# Patient Record
Sex: Female | Born: 1985 | Race: Black or African American | Hispanic: No | Marital: Single | Smoking: Never smoker
Health system: Southern US, Community
[De-identification: ages and names within clinical notes are randomized; demographics above are authoritative.]

## PROBLEM LIST (undated history)

## (undated) DIAGNOSIS — I10 Essential (primary) hypertension: Secondary | ICD-10-CM

## (undated) DIAGNOSIS — J45909 Unspecified asthma, uncomplicated: Secondary | ICD-10-CM

## (undated) DIAGNOSIS — E282 Polycystic ovarian syndrome: Secondary | ICD-10-CM

## (undated) HISTORY — PX: HIP SURGERY: SHX245

## (undated) HISTORY — DX: Essential (primary) hypertension: I10

---
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
## 2004-04-02 ENCOUNTER — Emergency Department (HOSPITAL_COMMUNITY): Admission: EM | Admit: 2004-04-02 | Discharge: 2004-04-02 | Payer: Self-pay | Admitting: Emergency Medicine

## 2008-04-04 ENCOUNTER — Emergency Department (HOSPITAL_COMMUNITY): Admission: EM | Admit: 2008-04-04 | Discharge: 2008-04-04 | Payer: Self-pay | Admitting: Emergency Medicine

## 2008-10-13 ENCOUNTER — Emergency Department (HOSPITAL_COMMUNITY): Admission: EM | Admit: 2008-10-13 | Discharge: 2008-10-14 | Payer: Self-pay | Admitting: Emergency Medicine

## 2010-11-18 LAB — POCT CARDIAC MARKERS
CKMB, poc: <1 ng/mL — ABNORMAL LOW (ref 1.0–8.0)
Myoglobin, poc: 48.4 ng/mL (ref 12–200)

## 2012-12-17 ENCOUNTER — Other Ambulatory Visit (HOSPITAL_COMMUNITY): Payer: Self-pay | Admitting: Physician Assistant

## 2012-12-17 ENCOUNTER — Ambulatory Visit (HOSPITAL_COMMUNITY)
Admission: RE | Admit: 2012-12-17 | Discharge: 2012-12-17 | Disposition: A | Discharge status: Home / Self Care | Payer: BC Managed Care – PPO | Source: Ambulatory Visit | Attending: Physician Assistant | Admitting: Physician Assistant

## 2012-12-17 DIAGNOSIS — M25571 Pain in right ankle and joints of right foot: Secondary | ICD-10-CM

## 2012-12-17 DIAGNOSIS — M25579 Pain in unspecified ankle and joints of unspecified foot: Secondary | ICD-10-CM | POA: Insufficient documentation

## 2015-03-12 ENCOUNTER — Other Ambulatory Visit (HOSPITAL_COMMUNITY): Payer: Self-pay | Admitting: Family Medicine

## 2015-03-12 DIAGNOSIS — N920 Excessive and frequent menstruation with regular cycle: Secondary | ICD-10-CM

## 2015-03-16 ENCOUNTER — Other Ambulatory Visit (HOSPITAL_COMMUNITY): Payer: Self-pay | Admitting: Family Medicine

## 2015-03-16 DIAGNOSIS — N920 Excessive and frequent menstruation with regular cycle: Secondary | ICD-10-CM

## 2015-03-17 ENCOUNTER — Ambulatory Visit (HOSPITAL_COMMUNITY): Payer: BLUE CROSS/BLUE SHIELD

## 2015-03-24 ENCOUNTER — Ambulatory Visit (HOSPITAL_COMMUNITY)
Admission: RE | Admit: 2015-03-24 | Discharge: 2015-03-24 | Disposition: A | Discharge status: Home / Self Care | Payer: BLUE CROSS/BLUE SHIELD | Source: Ambulatory Visit | Attending: Family Medicine | Admitting: Family Medicine

## 2015-03-24 DIAGNOSIS — N920 Excessive and frequent menstruation with regular cycle: Secondary | ICD-10-CM | POA: Insufficient documentation

## 2015-12-29 DIAGNOSIS — Z111 Encounter for screening for respiratory tuberculosis: Secondary | ICD-10-CM | POA: Diagnosis not present

## 2016-03-08 DIAGNOSIS — Z111 Encounter for screening for respiratory tuberculosis: Secondary | ICD-10-CM | POA: Diagnosis not present

## 2016-04-22 DIAGNOSIS — Z6841 Body Mass Index (BMI) 40.0 and over, adult: Secondary | ICD-10-CM | POA: Diagnosis not present

## 2016-04-22 DIAGNOSIS — H60502 Unspecified acute noninfective otitis externa, left ear: Secondary | ICD-10-CM | POA: Diagnosis not present

## 2016-04-22 DIAGNOSIS — Z1389 Encounter for screening for other disorder: Secondary | ICD-10-CM | POA: Diagnosis not present

## 2016-04-29 DIAGNOSIS — H9202 Otalgia, left ear: Secondary | ICD-10-CM | POA: Diagnosis not present

## 2016-04-29 DIAGNOSIS — Z1389 Encounter for screening for other disorder: Secondary | ICD-10-CM | POA: Diagnosis not present

## 2016-04-29 DIAGNOSIS — Z6841 Body Mass Index (BMI) 40.0 and over, adult: Secondary | ICD-10-CM | POA: Diagnosis not present

## 2016-04-29 DIAGNOSIS — H938X2 Other specified disorders of left ear: Secondary | ICD-10-CM | POA: Diagnosis not present

## 2016-09-16 IMAGING — US US PELVIS COMPLETE
1 series · 13 of 25 positions shown · non-contrast
Comparison: None in PACs

CLINICAL DATA: Menorrhagia with regular cycles, began oral
ovarian syndrome. Last menstrual period March 22, 2015.

EXAM:
TRANSABDOMINAL ULTRASOUND OF PELVIS
TECHNIQUE: Transabdominal ultrasound examination of the pelvis was performed
including evaluation of the uterus, ovaries, adnexal regions, and
pelvic cul-de-sac.

[Series 1: us pelvis complete · 0.22mm/px · 13 of 34 slices shown]
[im 1/34]
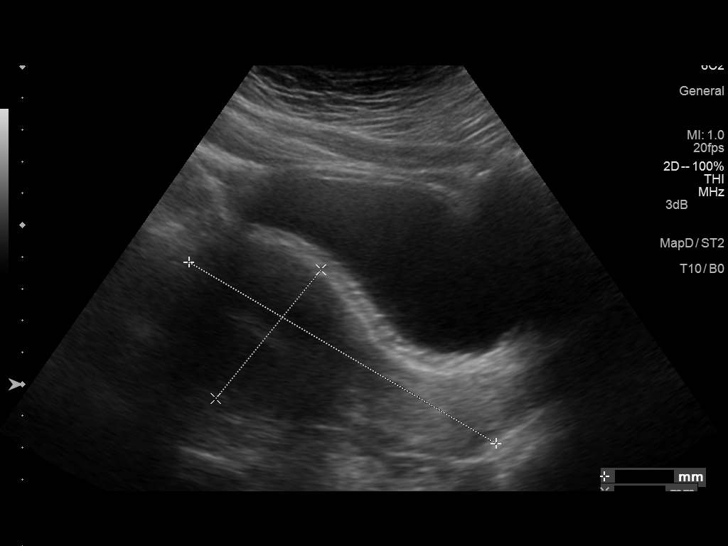
[im 3/34]
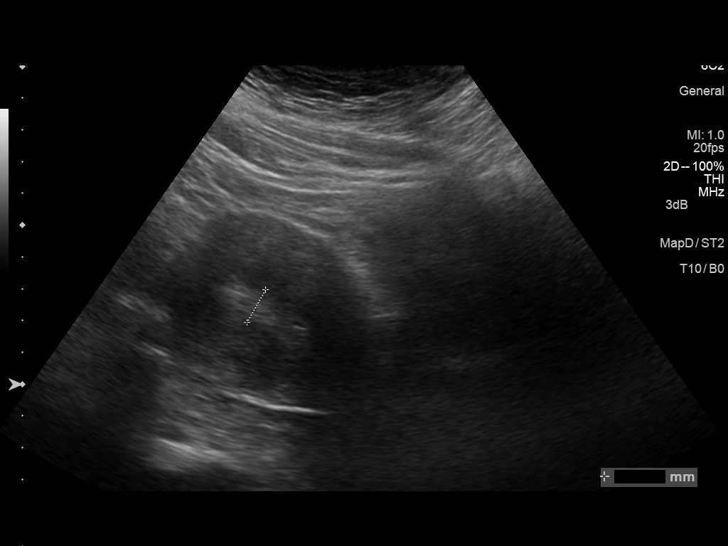
[im 6/34]
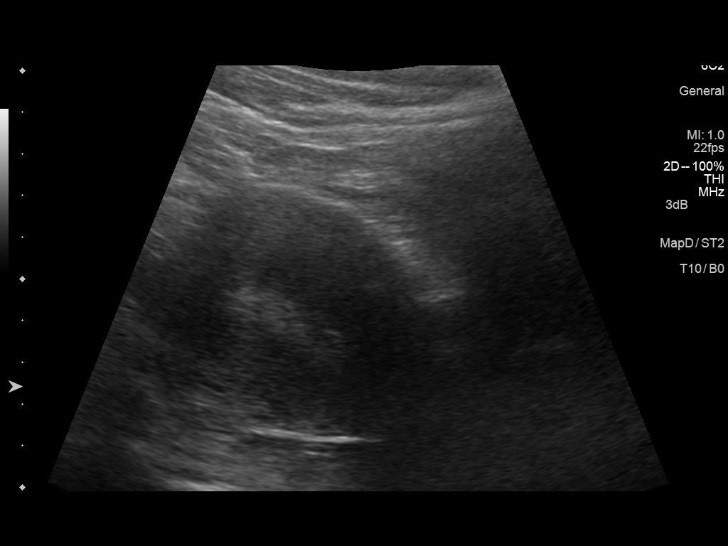
[im 9/34]
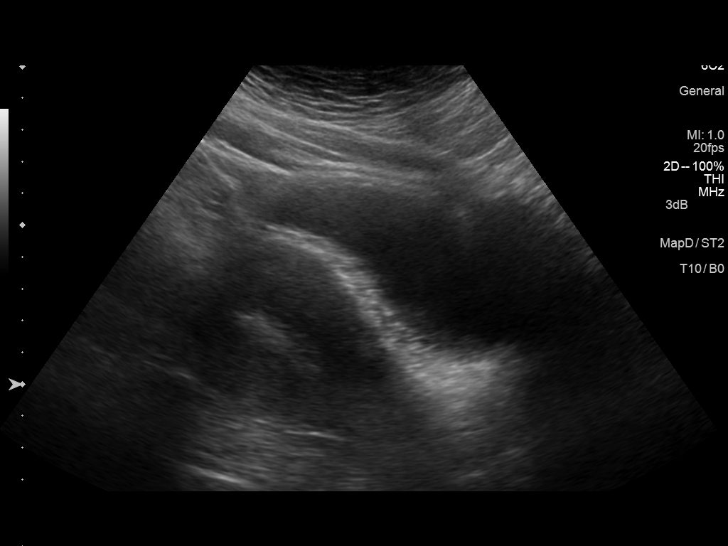
[im 12/34]
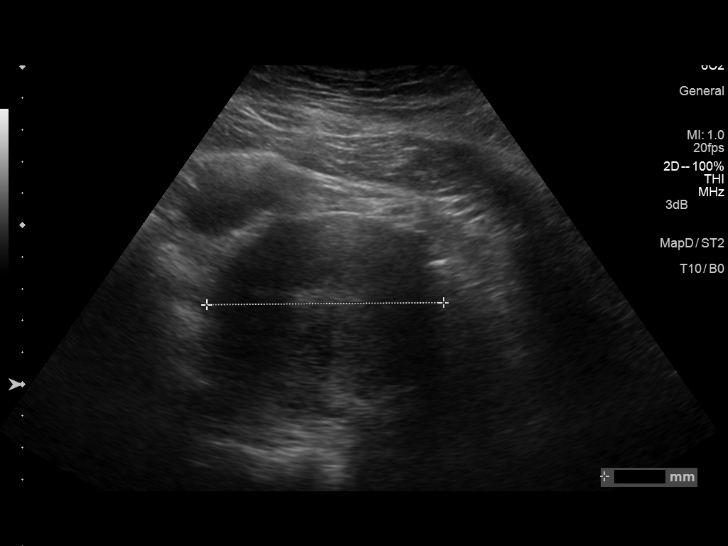
[im 14/34]
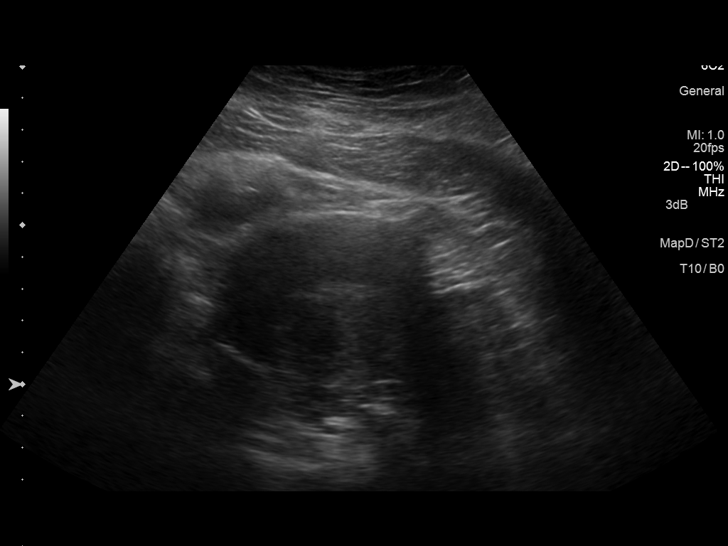
[im 17/34]
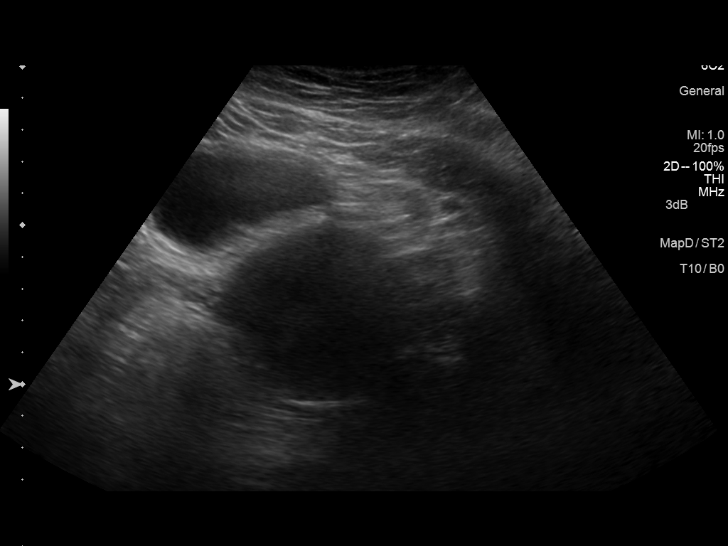
[im 20/34]
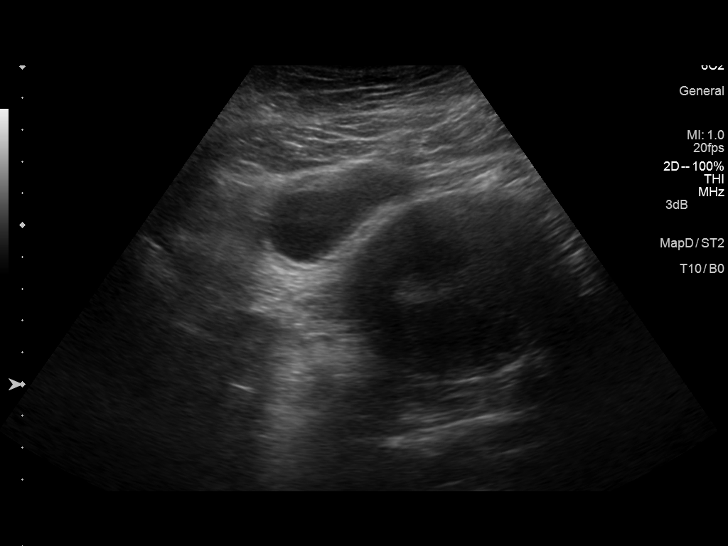
[im 23/34]
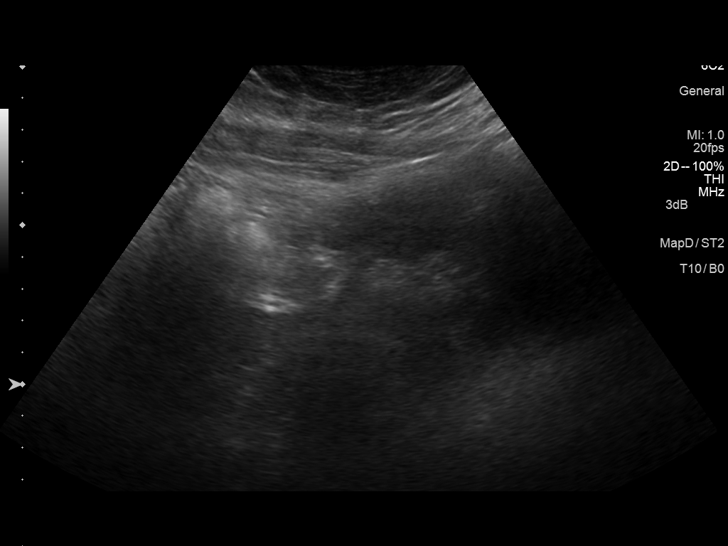
[im 25/34]
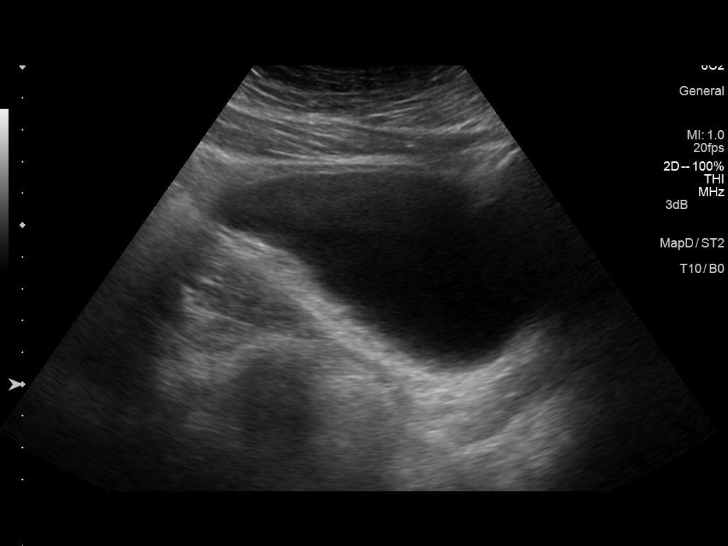
[im 28/34]
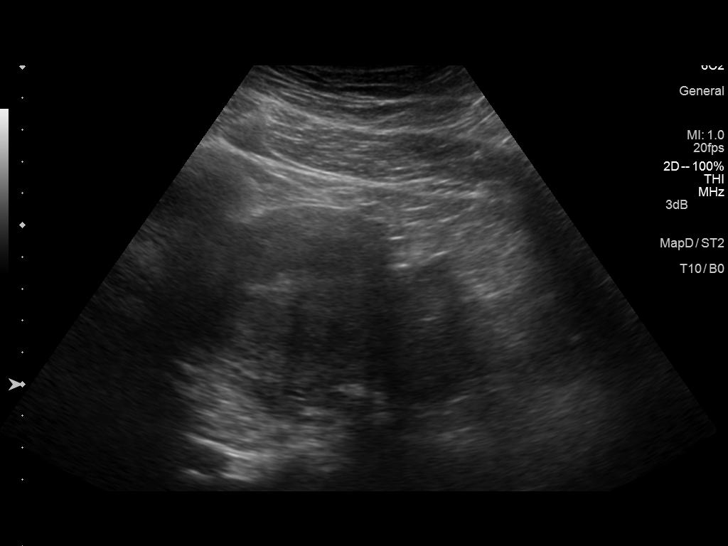
[im 31/34]
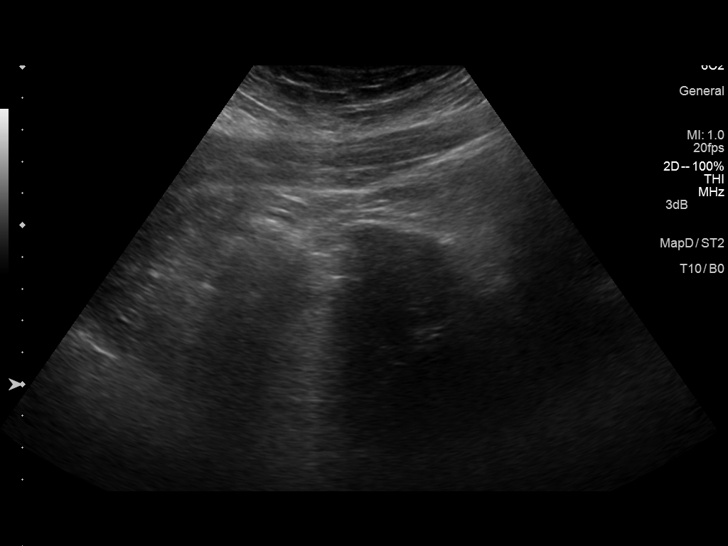
[im 34/34]
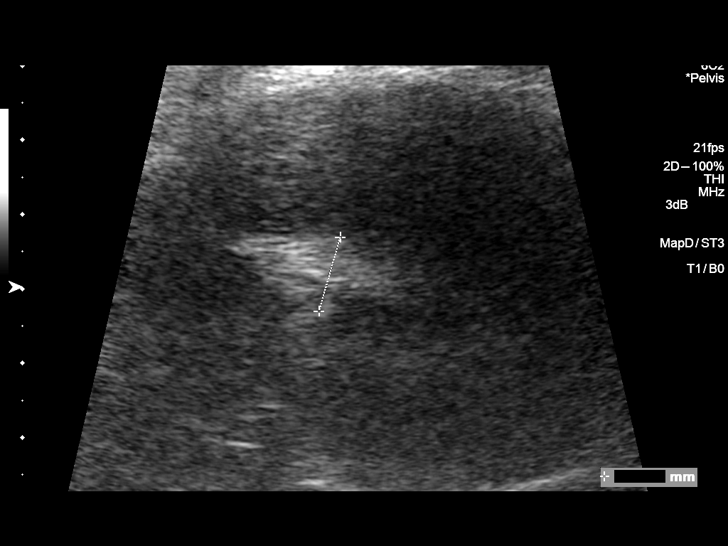

[13 of 25 positions shown; findings below may reference images not displayed]

FINDINGS: Transvaginal imaging was declined by the patient.

Uterus

Measurements: 11.2 x 5.2 x 7.5 cm. No fibroids or other mass
visualized.

Endometrium

Thickness: 12.4 mm.  No focal abnormality visualized.

Right ovary

The right ovary was not visualized.

Left ovary

Measurements: 3.5 x 3.1 x 2.9 cm. Normal appearance/no adnexal mass.

Other findings:  No free fluid
IMPRESSION: 1. The uterus is normal in size and contour. The endometrial stripe
is normal. If bleeding remains unresponsive to hormonal or medical
therapy, sonohysterogram should be considered for focal lesion
work-up. (Ref: Radiological Reasoning: Algorithmic Workup of
Abnormal Vaginal Bleeding with Endovaginal Sonography and
Sonohysterography. AJR 2336; 191:S68-73)
2. Nonvisualization of the right ovary. Normal appearance of the
left ovary. There are no findings to suggest polycystic ovarian
syndrome.
3. There is no free pelvic fluid.

## 2016-10-26 DIAGNOSIS — Z681 Body mass index (BMI) 19 or less, adult: Secondary | ICD-10-CM | POA: Diagnosis not present

## 2016-10-26 DIAGNOSIS — J069 Acute upper respiratory infection, unspecified: Secondary | ICD-10-CM | POA: Diagnosis not present

## 2016-10-26 DIAGNOSIS — J343 Hypertrophy of nasal turbinates: Secondary | ICD-10-CM | POA: Diagnosis not present

## 2016-10-26 DIAGNOSIS — I10 Essential (primary) hypertension: Secondary | ICD-10-CM | POA: Diagnosis not present

## 2016-10-26 DIAGNOSIS — J209 Acute bronchitis, unspecified: Secondary | ICD-10-CM | POA: Diagnosis not present

## 2016-10-26 DIAGNOSIS — J0100 Acute maxillary sinusitis, unspecified: Secondary | ICD-10-CM | POA: Diagnosis not present

## 2016-12-06 DIAGNOSIS — J342 Deviated nasal septum: Secondary | ICD-10-CM | POA: Diagnosis not present

## 2016-12-06 DIAGNOSIS — J310 Chronic rhinitis: Secondary | ICD-10-CM | POA: Diagnosis not present

## 2016-12-06 DIAGNOSIS — J343 Hypertrophy of nasal turbinates: Secondary | ICD-10-CM | POA: Diagnosis not present

## 2017-01-10 DIAGNOSIS — E282 Polycystic ovarian syndrome: Secondary | ICD-10-CM | POA: Diagnosis not present

## 2017-01-10 DIAGNOSIS — N926 Irregular menstruation, unspecified: Secondary | ICD-10-CM | POA: Diagnosis not present

## 2017-01-10 DIAGNOSIS — N760 Acute vaginitis: Secondary | ICD-10-CM | POA: Diagnosis not present

## 2017-01-10 DIAGNOSIS — Z1389 Encounter for screening for other disorder: Secondary | ICD-10-CM | POA: Diagnosis not present

## 2017-01-10 DIAGNOSIS — Z0001 Encounter for general adult medical examination with abnormal findings: Secondary | ICD-10-CM | POA: Diagnosis not present

## 2017-01-10 DIAGNOSIS — L680 Hirsutism: Secondary | ICD-10-CM | POA: Diagnosis not present

## 2017-01-10 DIAGNOSIS — Z6841 Body Mass Index (BMI) 40.0 and over, adult: Secondary | ICD-10-CM | POA: Diagnosis not present

## 2017-03-21 DIAGNOSIS — Z6841 Body Mass Index (BMI) 40.0 and over, adult: Secondary | ICD-10-CM | POA: Diagnosis not present

## 2017-03-21 DIAGNOSIS — Z1151 Encounter for screening for human papillomavirus (HPV): Secondary | ICD-10-CM | POA: Diagnosis not present

## 2017-03-21 DIAGNOSIS — L680 Hirsutism: Secondary | ICD-10-CM | POA: Diagnosis not present

## 2017-03-21 DIAGNOSIS — Z01419 Encounter for gynecological examination (general) (routine) without abnormal findings: Secondary | ICD-10-CM | POA: Diagnosis not present

## 2017-04-04 DIAGNOSIS — R635 Abnormal weight gain: Secondary | ICD-10-CM | POA: Diagnosis not present

## 2017-04-11 DIAGNOSIS — E559 Vitamin D deficiency, unspecified: Secondary | ICD-10-CM | POA: Diagnosis not present

## 2017-04-11 DIAGNOSIS — Z6841 Body Mass Index (BMI) 40.0 and over, adult: Secondary | ICD-10-CM | POA: Diagnosis not present

## 2017-04-11 DIAGNOSIS — R7303 Prediabetes: Secondary | ICD-10-CM | POA: Diagnosis not present

## 2017-04-11 DIAGNOSIS — I10 Essential (primary) hypertension: Secondary | ICD-10-CM | POA: Diagnosis not present

## 2017-04-18 DIAGNOSIS — E282 Polycystic ovarian syndrome: Secondary | ICD-10-CM | POA: Diagnosis not present

## 2017-04-18 DIAGNOSIS — N938 Other specified abnormal uterine and vaginal bleeding: Secondary | ICD-10-CM | POA: Diagnosis not present

## 2017-04-18 DIAGNOSIS — I10 Essential (primary) hypertension: Secondary | ICD-10-CM | POA: Diagnosis not present

## 2017-04-18 DIAGNOSIS — E559 Vitamin D deficiency, unspecified: Secondary | ICD-10-CM | POA: Diagnosis not present

## 2017-04-18 DIAGNOSIS — R7303 Prediabetes: Secondary | ICD-10-CM | POA: Diagnosis not present

## 2017-04-18 DIAGNOSIS — L680 Hirsutism: Secondary | ICD-10-CM | POA: Diagnosis not present

## 2017-05-02 DIAGNOSIS — I10 Essential (primary) hypertension: Secondary | ICD-10-CM | POA: Diagnosis not present

## 2017-05-02 DIAGNOSIS — R7303 Prediabetes: Secondary | ICD-10-CM | POA: Diagnosis not present

## 2017-06-06 DIAGNOSIS — R7303 Prediabetes: Secondary | ICD-10-CM | POA: Diagnosis not present

## 2017-06-06 DIAGNOSIS — I10 Essential (primary) hypertension: Secondary | ICD-10-CM | POA: Diagnosis not present

## 2017-06-06 DIAGNOSIS — E559 Vitamin D deficiency, unspecified: Secondary | ICD-10-CM | POA: Diagnosis not present

## 2017-06-14 DIAGNOSIS — Z1389 Encounter for screening for other disorder: Secondary | ICD-10-CM | POA: Diagnosis not present

## 2017-06-14 DIAGNOSIS — L089 Local infection of the skin and subcutaneous tissue, unspecified: Secondary | ICD-10-CM | POA: Diagnosis not present

## 2017-06-14 DIAGNOSIS — Z6841 Body Mass Index (BMI) 40.0 and over, adult: Secondary | ICD-10-CM | POA: Diagnosis not present

## 2017-07-27 DIAGNOSIS — Z1389 Encounter for screening for other disorder: Secondary | ICD-10-CM | POA: Diagnosis not present

## 2017-07-27 DIAGNOSIS — J029 Acute pharyngitis, unspecified: Secondary | ICD-10-CM | POA: Diagnosis not present

## 2017-07-27 DIAGNOSIS — Z6841 Body Mass Index (BMI) 40.0 and over, adult: Secondary | ICD-10-CM | POA: Diagnosis not present

## 2017-07-27 DIAGNOSIS — J069 Acute upper respiratory infection, unspecified: Secondary | ICD-10-CM | POA: Diagnosis not present

## 2017-08-03 DIAGNOSIS — N938 Other specified abnormal uterine and vaginal bleeding: Secondary | ICD-10-CM | POA: Diagnosis not present

## 2018-07-23 DIAGNOSIS — B9711 Coxsackievirus as the cause of diseases classified elsewhere: Secondary | ICD-10-CM | POA: Diagnosis not present

## 2018-07-23 DIAGNOSIS — Z1389 Encounter for screening for other disorder: Secondary | ICD-10-CM | POA: Diagnosis not present

## 2018-07-23 DIAGNOSIS — Z6841 Body Mass Index (BMI) 40.0 and over, adult: Secondary | ICD-10-CM | POA: Diagnosis not present

## 2018-07-23 DIAGNOSIS — B999 Unspecified infectious disease: Secondary | ICD-10-CM | POA: Diagnosis not present

## 2018-07-26 DIAGNOSIS — Z6841 Body Mass Index (BMI) 40.0 and over, adult: Secondary | ICD-10-CM | POA: Diagnosis not present

## 2018-07-26 DIAGNOSIS — Z1389 Encounter for screening for other disorder: Secondary | ICD-10-CM | POA: Diagnosis not present

## 2018-07-26 DIAGNOSIS — N611 Abscess of the breast and nipple: Secondary | ICD-10-CM | POA: Diagnosis not present

## 2019-02-20 DIAGNOSIS — Z111 Encounter for screening for respiratory tuberculosis: Secondary | ICD-10-CM | POA: Diagnosis not present

## 2019-06-06 DIAGNOSIS — I10 Essential (primary) hypertension: Secondary | ICD-10-CM | POA: Diagnosis not present

## 2019-06-06 DIAGNOSIS — Z6841 Body Mass Index (BMI) 40.0 and over, adult: Secondary | ICD-10-CM | POA: Diagnosis not present

## 2019-06-06 DIAGNOSIS — Z1389 Encounter for screening for other disorder: Secondary | ICD-10-CM | POA: Diagnosis not present

## 2019-08-13 ENCOUNTER — Ambulatory Visit: Payer: BC Managed Care – PPO | Attending: Internal Medicine

## 2019-08-13 ENCOUNTER — Other Ambulatory Visit: Payer: Self-pay

## 2019-08-13 DIAGNOSIS — Z20828 Contact with and (suspected) exposure to other viral communicable diseases: Secondary | ICD-10-CM | POA: Diagnosis not present

## 2019-08-13 DIAGNOSIS — Z20822 Contact with and (suspected) exposure to covid-19: Secondary | ICD-10-CM | POA: Diagnosis not present

## 2019-08-13 DIAGNOSIS — Z681 Body mass index (BMI) 19 or less, adult: Secondary | ICD-10-CM | POA: Diagnosis not present

## 2019-08-13 DIAGNOSIS — J22 Unspecified acute lower respiratory infection: Secondary | ICD-10-CM | POA: Diagnosis not present

## 2019-08-15 LAB — NOVEL CORONAVIRUS, NAA: SARS-CoV-2, NAA: DETECTED — AB

## 2019-11-27 DIAGNOSIS — Z6841 Body Mass Index (BMI) 40.0 and over, adult: Secondary | ICD-10-CM | POA: Diagnosis not present

## 2019-11-27 DIAGNOSIS — Z1389 Encounter for screening for other disorder: Secondary | ICD-10-CM | POA: Diagnosis not present

## 2019-11-27 DIAGNOSIS — L03818 Cellulitis of other sites: Secondary | ICD-10-CM | POA: Diagnosis not present

## 2019-12-05 ENCOUNTER — Encounter: Payer: Self-pay | Admitting: General Surgery

## 2019-12-05 ENCOUNTER — Ambulatory Visit: Payer: BC Managed Care – PPO | Admitting: General Surgery

## 2019-12-05 ENCOUNTER — Other Ambulatory Visit: Payer: Self-pay

## 2019-12-05 VITALS — BP 184/111 | HR 93 | Temp 98.3°F | Resp 12 | Ht 62.0 in | Wt 362.0 lb

## 2019-12-05 DIAGNOSIS — N611 Abscess of the breast and nipple: Secondary | ICD-10-CM | POA: Diagnosis not present

## 2019-12-05 NOTE — Progress Notes (Signed)
Amanda Herrera; [id number]; 1986/06/24   HPI Patient is a 34 year old black female who was referred to my care by Collene Mares for evaluation treatment of recurrent right breast abscess.  Patient states that over the past 2 years, she has had recurrent episodes of purulent drainage and tenderness in the upper, inner quadrant of the right breast close to the chest wall.  The area would swell and then drained some purulent material and resolved.  She has been on an antibiotic for this recently.  While she was coming to the office today, the swelling started draining purulent material.  She denies any fever or chills.  She was requesting advice as to how to prevent the recurrence.  She has been trying to clean the skin with alcohol and/or peroxide.  She currently has no breast pain.  There is no family history of breast cancer.  She has not had any imaging of the breast. Past Medical History:  Diagnosis Date  . Hypertension     Past Surgical History:  Procedure Laterality Date  . HIP SURGERY      Family History  Problem Relation Age of Onset  . Hypertension Mother   . Hypertension Father   . Diabetes Father     Current Outpatient Medications on File Prior to Visit  Medication Sig Dispense Refill  . albuterol (VENTOLIN HFA) 108 (90 Base) MCG/ACT inhaler SMARTSIG:2 Puff(s) By Mouth Every 6 Hours    . amLODipine (NORVASC) 5 MG tablet Take 5 mg by mouth daily.    . Cholecalciferol (VITAMIN D3) 1.25 MG (50000 UT) CAPS     . hydrochlorothiazide (HYDRODIURIL) 25 MG tablet Take 25 mg by mouth daily.     No current facility-administered medications on file prior to visit.    Allergies  Allergen Reactions  . Penicillins     Social History   Substance and Sexual Activity  Alcohol Use None   Comment: rare    Social History   Tobacco Use  Smoking Status Never Smoker  Smokeless Tobacco Never Used    Review of Systems  Constitutional: Negative.   HENT: Negative.   Eyes: Negative.    Respiratory: Negative.   Cardiovascular: Negative.   Gastrointestinal: Negative.   Genitourinary: Negative.   Musculoskeletal: Negative.   Skin: Positive for itching and rash.  Neurological: Negative.   Endo/Heme/Allergies: Negative.   Psychiatric/Behavioral: Negative.     Objective   Vitals:   12/05/19 1007  BP: (!) 184/111  Pulse: 93  Resp: 12  Temp: 98.3 F (36.8 C)  SpO2: 95%    Physical Exam Vitals reviewed.  Constitutional:      Appearance: Normal appearance. She is obese. She is not ill-appearing.  HENT:     Head: Normocephalic and atraumatic.  Cardiovascular:     Rate and Rhythm: Normal rate and regular rhythm.     Heart sounds: Normal heart sounds. No murmur. No friction rub. No gallop.   Pulmonary:     Effort: Pulmonary effort is normal. No respiratory distress.     Breath sounds: Normal breath sounds. No stridor. No wheezing, rhonchi or rales.  Skin:    General: Skin is warm and dry.  Neurological:     Mental Status: She is alert and oriented to person, place, and time.   Breast: Very large breasts.  A 2 to 3 cm oval fluctuant subcutaneous air is noted in the upper, inner quadrant of the right breast just off the midline.  There is a small punctate wound  with a small amount of purulent drainage present.  No significant induration or erythema is present.  Dry skin is present around this area.  No palpable masses are noted.  Assessment  Recurrent right breast abscess, this may have started as a sebaceous cyst in the past Plan   At this point, I told her to try to express as much fluid as possible daily.  She should keep the area clean and soap with water.  No alcohol or peroxide should be applied.  She should finish her antibiotic course.  She may require formal excision of this area in the future.  She is to follow-up with me in 1 month.  No need for acute surgical invention at this time.  She may return to work without restrictions on Dec 09, 2019.

## 2020-01-07 ENCOUNTER — Telehealth: Payer: Self-pay

## 2020-01-07 ENCOUNTER — Other Ambulatory Visit: Payer: Self-pay

## 2020-01-07 ENCOUNTER — Encounter: Payer: Self-pay | Admitting: General Surgery

## 2020-01-07 ENCOUNTER — Ambulatory Visit: Payer: BC Managed Care – PPO | Admitting: General Surgery

## 2020-01-07 VITALS — BP 177/135 | HR 87 | Temp 98.4°F | Resp 12 | Ht 62.0 in | Wt 367.0 lb

## 2020-01-07 DIAGNOSIS — N611 Abscess of the breast and nipple: Secondary | ICD-10-CM | POA: Diagnosis not present

## 2020-01-07 MED ORDER — SULFAMETHOXAZOLE-TRIMETHOPRIM 400-80 MG PO TABS: 1.0000 | ORAL_TABLET | Freq: Two times a day (BID) | ORAL | 1 refills | Status: DC

## 2020-01-07 NOTE — Telephone Encounter (Signed)
Right breast cyst draining, swollen and very painful. Requesting another refill on antibiotic.

## 2020-01-08 NOTE — Progress Notes (Signed)
Subjective:     Amanda Herrera  Patient presents back with recurrent swelling from a known cystic area in the upper, inner quadrant of the right breast close to the chest wall.  Patient denies any drainage from this area.  She thinks it has gotten a little redder and is tender to touch. Objective:    BP (!) 177/135   Pulse 87   Temp 98.4 F (36.9 C) (Oral)   Resp 12   Ht 5\' 2"  (1.575 m)   Wt (!) 367 lb (166.5 kg)   SpO2 95%   BMI 67.13 kg/m   General:  alert, cooperative and no distress  Right breast with 2 cm area of mild erythema and tenderness.  A single punctum is present.  No active drainage is noted.  This is in the upper, inner quadrant of the right breast close to the chest wall.     Assessment:    Recurrent infected right breast cyst    Plan:   We will start Bactrim 1 tablet p.o. twice daily x10 days.  Follow-up in 3 weeks.  May need formal surgical excision.

## 2020-01-14 ENCOUNTER — Ambulatory Visit: Payer: BC Managed Care – PPO | Admitting: General Surgery

## 2020-02-04 ENCOUNTER — Encounter: Payer: Self-pay | Admitting: General Surgery

## 2020-02-04 ENCOUNTER — Other Ambulatory Visit: Payer: Self-pay

## 2020-02-04 ENCOUNTER — Ambulatory Visit (INDEPENDENT_AMBULATORY_CARE_PROVIDER_SITE_OTHER): Payer: BC Managed Care – PPO | Admitting: General Surgery

## 2020-02-04 VITALS — BP 163/112 | HR 86 | Temp 96.4°F | Resp 16 | Ht 62.0 in | Wt 361.0 lb

## 2020-02-04 DIAGNOSIS — N6081 Other benign mammary dysplasias of right breast: Secondary | ICD-10-CM

## 2020-02-05 NOTE — Progress Notes (Signed)
Subjective:     Amanda Herrera  Patient is here for follow-up of sebaceous cyst on her right breast.  Patient states it has decreased in size since it drained clear yellow fluid.  No tenderness is noted. Objective:    BP (!) 163/112   Pulse 86   Temp (!) 96.4 F (35.8 C) (Other (Comment))   Resp 16   Ht 5\' 2"  (1.575 m)   Wt (!) 361 lb (163.7 kg)   SpO2 97%   BMI 66.03 kg/m   General:  alert, cooperative and no distress  Right breast with small residual cystic lesion in the upper, inner quadrant at the chest wall.  No drainage is noted.  No erythema is noted.     Assessment:    Sebaceous cyst, right breast, resolved    Plan:   Patient will consider excision of the sebaceous cyst if it keeps recurring.  Follow-up here as needed.

## 2020-05-15 DIAGNOSIS — Z01411 Encounter for gynecological examination (general) (routine) with abnormal findings: Secondary | ICD-10-CM | POA: Diagnosis not present

## 2020-05-15 DIAGNOSIS — Z6841 Body Mass Index (BMI) 40.0 and over, adult: Secondary | ICD-10-CM | POA: Diagnosis not present

## 2020-05-15 DIAGNOSIS — Z124 Encounter for screening for malignant neoplasm of cervix: Secondary | ICD-10-CM | POA: Diagnosis not present

## 2020-05-15 DIAGNOSIS — L680 Hirsutism: Secondary | ICD-10-CM | POA: Diagnosis not present

## 2020-05-15 DIAGNOSIS — I10 Essential (primary) hypertension: Secondary | ICD-10-CM | POA: Diagnosis not present

## 2020-05-15 DIAGNOSIS — E282 Polycystic ovarian syndrome: Secondary | ICD-10-CM | POA: Diagnosis not present

## 2020-05-15 DIAGNOSIS — L918 Other hypertrophic disorders of the skin: Secondary | ICD-10-CM | POA: Diagnosis not present

## 2020-05-15 DIAGNOSIS — J45909 Unspecified asthma, uncomplicated: Secondary | ICD-10-CM | POA: Diagnosis not present

## 2020-05-29 ENCOUNTER — Other Ambulatory Visit (HOSPITAL_COMMUNITY): Payer: Self-pay | Admitting: Family Medicine

## 2020-05-29 ENCOUNTER — Other Ambulatory Visit: Payer: Self-pay | Admitting: Family Medicine

## 2020-06-01 ENCOUNTER — Other Ambulatory Visit (HOSPITAL_COMMUNITY): Payer: Self-pay | Admitting: Family Medicine

## 2020-06-01 DIAGNOSIS — E282 Polycystic ovarian syndrome: Secondary | ICD-10-CM

## 2020-06-05 ENCOUNTER — Ambulatory Visit (HOSPITAL_COMMUNITY)
Admission: RE | Admit: 2020-06-05 | Discharge: 2020-06-05 | Disposition: A | Discharge status: Home / Self Care | Payer: BC Managed Care – PPO | Source: Ambulatory Visit | Attending: Family Medicine | Admitting: Family Medicine

## 2020-06-05 ENCOUNTER — Other Ambulatory Visit (HOSPITAL_COMMUNITY): Payer: Self-pay | Admitting: Family Medicine

## 2020-06-05 ENCOUNTER — Other Ambulatory Visit: Payer: Self-pay

## 2020-06-05 DIAGNOSIS — N939 Abnormal uterine and vaginal bleeding, unspecified: Secondary | ICD-10-CM | POA: Diagnosis not present

## 2020-06-05 DIAGNOSIS — E282 Polycystic ovarian syndrome: Secondary | ICD-10-CM | POA: Diagnosis not present

## 2020-06-12 DIAGNOSIS — R7303 Prediabetes: Secondary | ICD-10-CM | POA: Diagnosis not present

## 2020-06-12 DIAGNOSIS — E039 Hypothyroidism, unspecified: Secondary | ICD-10-CM | POA: Diagnosis not present

## 2020-06-12 DIAGNOSIS — N951 Menopausal and female climacteric states: Secondary | ICD-10-CM | POA: Diagnosis not present

## 2020-06-12 DIAGNOSIS — R635 Abnormal weight gain: Secondary | ICD-10-CM | POA: Diagnosis not present

## 2020-06-12 DIAGNOSIS — I10 Essential (primary) hypertension: Secondary | ICD-10-CM | POA: Diagnosis not present

## 2020-06-12 DIAGNOSIS — E559 Vitamin D deficiency, unspecified: Secondary | ICD-10-CM | POA: Diagnosis not present

## 2020-08-17 ENCOUNTER — Ambulatory Visit: Payer: Self-pay | Admitting: Cardiovascular Disease

## 2020-09-08 ENCOUNTER — Encounter: Payer: Self-pay | Admitting: Internal Medicine

## 2021-03-13 ENCOUNTER — Ambulatory Visit (INDEPENDENT_AMBULATORY_CARE_PROVIDER_SITE_OTHER): Payer: 59

## 2021-03-13 ENCOUNTER — Encounter: Payer: Self-pay | Admitting: Emergency Medicine

## 2021-03-13 ENCOUNTER — Other Ambulatory Visit: Payer: Self-pay

## 2021-03-13 ENCOUNTER — Ambulatory Visit
Admission: EM | Admit: 2021-03-13 | Discharge: 2021-03-13 | Disposition: A | Discharge status: Home / Self Care | Payer: 59 | Attending: Physician Assistant | Admitting: Physician Assistant

## 2021-03-13 DIAGNOSIS — R0602 Shortness of breath: Secondary | ICD-10-CM | POA: Diagnosis not present

## 2021-03-13 DIAGNOSIS — Z20822 Contact with and (suspected) exposure to covid-19: Secondary | ICD-10-CM | POA: Diagnosis not present

## 2021-03-13 DIAGNOSIS — R509 Fever, unspecified: Secondary | ICD-10-CM

## 2021-03-13 DIAGNOSIS — R059 Cough, unspecified: Secondary | ICD-10-CM | POA: Diagnosis not present

## 2021-03-13 MED ORDER — DOXYCYCLINE HYCLATE 100 MG PO CAPS: 100.0000 mg | ORAL_CAPSULE | Freq: Two times a day (BID) | ORAL | 0 refills | Status: DC

## 2021-03-13 MED ORDER — PREDNISONE 10 MG (21) PO TBPK: ORAL_TABLET | Freq: Every day | ORAL | 0 refills | Status: DC

## 2021-03-13 MED ORDER — ACETAMINOPHEN 325 MG PO TABS
975.0000 mg | ORAL_TABLET | Freq: Once | ORAL | Status: AC
  Administered 2021-03-13: 975 mg via ORAL

## 2021-03-13 NOTE — ED Triage Notes (Signed)
Feels SOB due to coughing, sinus drainage x 3 to 4 days.  States when she turns her head, she feels dizzy.  Was seen by PCP for dizziness waiting on results to blood work.  At home covid test yesterday was negative

## 2021-03-13 NOTE — ED Provider Notes (Signed)
[facility]    CSN: [id number] Arrival date & time: 03/13/21  0950      History   Chief Complaint No chief complaint on file.   HPI Amanda Herrera is a 35 y.o. female.   Pt with history of asthma.  Complains of cough, shortness of breath, sinus pressure that started about one week ago.  She tested negative for COVID at symptom onset.  She reports she was seen by PCP for fatigue and shortness of breath, lab work done at this time.  Denies chest pain, palpitations. She did not have a fever at that time.  She has been using her albuterol inhaler with minimal improvement.  Taking no other medications.    Past Medical History:  Diagnosis Date   Hypertension     There are no problems to display for this patient.   Past Surgical History:  Procedure Laterality Date   HIP SURGERY      OB History   No obstetric history on file.      Home Medications    Prior to Admission medications   Medication Sig Start Date End Date Taking? Authorizing Provider  doxycycline (VIBRAMYCIN) 100 MG capsule Take 1 capsule (100 mg total) by mouth 2 (two) times daily. 03/13/21  Yes Crescencio Jozwiak, PA-C  predniSONE (STERAPRED UNI-PAK 21 TAB) 10 MG (21) TBPK tablet Take by mouth daily. Take 6 tabs by mouth daily  for 2 days, then 5 tabs for 2 days, then 4 tabs for 2 days, then 3 tabs for 2 days, 2 tabs for 2 days, then 1 tab by mouth daily for 2 days 03/13/21  Yes Kassadie Pancake, PA-C  albuterol (VENTOLIN HFA) 108 (90 Base) MCG/ACT inhaler SMARTSIG:2 Puff(s) By Mouth Every 6 Hours 08/16/19   [provider]  amLODipine (NORVASC) 5 MG tablet Take 5 mg by mouth daily. 12/02/19   [provider]  Cholecalciferol (VITAMIN D3) 1.25 MG (50000 UT) CAPS  08/13/19   [provider]  hydrochlorothiazide (HYDRODIURIL) 25 MG tablet Take 25 mg by mouth daily. 12/02/19   [provider]    Family History Family History  Problem Relation Age of Onset   Hypertension  Mother    Hypertension Father    Diabetes Father     Social History Social History   Tobacco Use   Smoking status: Never   Smokeless tobacco: Never  Substance Use Topics   Drug use: Never     Allergies   Penicillins   Review of Systems Review of Systems  Constitutional:  Positive for fever. Negative for chills.  HENT:  Negative for ear pain and sore throat.   Eyes:  Negative for pain and visual disturbance.  Respiratory:  Positive for cough and shortness of breath.   Cardiovascular:  Negative for chest pain and palpitations.  Gastrointestinal:  Negative for abdominal pain, nausea and vomiting.  Genitourinary:  Negative for dysuria and hematuria.  Musculoskeletal:  Negative for arthralgias and back pain.  Skin:  Negative for color change and rash.  Neurological:  Negative for seizures and syncope.  All other systems reviewed and are negative.   Physical Exam Triage Vital Signs ED Triage Vitals  Enc Vitals Group     BP 03/13/21 1044 (!) 153/110     Pulse Rate 03/13/21 1044 (!) 103     Resp 03/13/21 1044 16     Temp 03/13/21 1044 (!) 100.6 F (38.1 C)     Temp Source 03/13/21 1044 Oral  SpO2 03/13/21 1044 98 %     Weight --      Height --      Head Circumference --      Peak Flow --      Pain Score 03/13/21 1046 0     Pain Loc --      Pain Edu? --      Excl. in GC? --    No data found.  Updated Vital Signs BP (!) 153/110 (BP Location: Right Arm)   Pulse (!) 103   Temp (!) 100.6 F (38.1 C) (Oral)   Resp 16   SpO2 98%   Visual Acuity Right Eye Distance:   Left Eye Distance:   Bilateral Distance:    Right Eye Near:   Left Eye Near:    Bilateral Near:     Physical Exam Vitals and nursing note reviewed.  Constitutional:      General: She is not in acute distress.    Appearance: She is well-developed.  HENT:     Head: Normocephalic and atraumatic.  Eyes:     Conjunctiva/sclera: Conjunctivae normal.  Cardiovascular:     Rate and Rhythm:  Normal rate and regular rhythm.     Heart sounds: No murmur heard. Pulmonary:     Effort: Pulmonary effort is normal. No respiratory distress.     Breath sounds: No stridor. Examination of the right-upper field reveals rales. Examination of the left-upper field reveals rales. Rales present.  Abdominal:     Palpations: Abdomen is soft.     Tenderness: There is no abdominal tenderness.  Musculoskeletal:     Cervical back: Neck supple.  Skin:    General: Skin is warm and dry.  Neurological:     Mental Status: She is alert.     UC Treatments / Results  Labs (all labs ordered are listed, but only abnormal results are displayed) Labs Reviewed - No data to display  EKG   Radiology DG Chest 2 View  Result Date: 03/13/2021 CLINICAL DATA:  Shortness of breath, fever. EXAM: CHEST - 2 VIEW COMPARISON:  10/14/2008 FINDINGS: Both lungs are clear. Heart and mediastinum are within normal limits. Trachea is midline. Negative for a pneumothorax. No acute bone abnormality. IMPRESSION: No active cardiopulmonary disease. Electronically Signed   By: Richarda Overlie M.D.   On: 03/13/2021 11:22    Procedures Procedures (including critical care time)  Medications Ordered in UC Medications  acetaminophen (TYLENOL) tablet 975 mg (975 mg Oral Given 03/13/21 1102)    Initial Impression / Assessment and Plan / UC Course  I have reviewed the triage vital signs and the nursing notes.  Pertinent labs & imaging results that were available during my care of the patient were reviewed by me and considered in my medical decision making (see chart for details).     Xray negative for PNA.  Will treat asthma exacerbation with prednisone.  Pt to continue inhaler.  Pt given tylenol in clinic today with improvement of fever, pulse rate on recheck. Stable for discharge. Strict return precautions discussed. COVID test done in clinic today.  Final Clinical Impressions(s) / UC Diagnoses   Final diagnoses:  None      Discharge Instructions      Take medications as prescribed Continue with albuterol inhaler as needed COVID test pending Recommend Delsym cough syrup Return if no improvement of symptoms or symptoms worsen     ED Prescriptions     Medication Sig Dispense Auth. Provider   predniSONE [facility]  UNI-PAK 21 TAB) 10 MG (21) TBPK tablet Take by mouth daily. Take 6 tabs by mouth daily  for 2 days, then 5 tabs for 2 days, then 4 tabs for 2 days, then 3 tabs for 2 days, 2 tabs for 2 days, then 1 tab by mouth daily for 2 days 42 tablet Katheryne Gorr, PA-C   doxycycline (VIBRAMYCIN) 100 MG capsule Take 1 capsule (100 mg total) by mouth 2 (two) times daily. 20 capsule Jodell Cipro, PA-C      PDMP not reviewed this encounter.   Jodell Cipro, PA-C 03/13/21 1134

## 2021-03-13 NOTE — Discharge Instructions (Addendum)
Take medications as prescribed Continue with albuterol inhaler as needed COVID test pending Recommend Delsym cough syrup Return if no improvement of symptoms or symptoms worsen

## 2021-03-15 LAB — NOVEL CORONAVIRUS, NAA: SARS-CoV-2, NAA: NOT DETECTED

## 2021-03-15 LAB — SARS-COV-2, NAA 2 DAY TAT

## 2021-04-10 ENCOUNTER — Other Ambulatory Visit: Payer: Self-pay

## 2021-04-10 ENCOUNTER — Encounter: Payer: Self-pay | Admitting: Emergency Medicine

## 2021-04-10 ENCOUNTER — Ambulatory Visit
Admission: EM | Admit: 2021-04-10 | Discharge: 2021-04-10 | Disposition: A | Discharge status: Home / Self Care | Payer: 59 | Attending: Internal Medicine | Admitting: Internal Medicine

## 2021-04-10 DIAGNOSIS — Z113 Encounter for screening for infections with a predominantly sexual mode of transmission: Secondary | ICD-10-CM | POA: Diagnosis present

## 2021-04-10 NOTE — Discharge Instructions (Addendum)
We will call with recommendations if labs are abnormal.

## 2021-04-10 NOTE — ED Provider Notes (Signed)
[facility]    CSN: [id number] Arrival date & time: 04/10/21  0801      History   Chief Complaint No chief complaint on file.   HPI Amanda Herrera is a 35 y.o. female comes to urgent care for STD testing.  Patient has no symptoms.  She recently had unprotected sexual intercourse about a month ago.   HPI  Past Medical History:  Diagnosis Date   Hypertension     There are no problems to display for this patient.   Past Surgical History:  Procedure Laterality Date   HIP SURGERY      OB History   No obstetric history on file.      Home Medications    Prior to Admission medications   Medication Sig Start Date End Date Taking? Authorizing Provider  albuterol (VENTOLIN HFA) 108 (90 Base) MCG/ACT inhaler SMARTSIG:2 Puff(s) By Mouth Every 6 Hours 08/16/19   [provider]  amLODipine (NORVASC) 5 MG tablet Take 5 mg by mouth daily. 12/02/19   [provider]  Cholecalciferol (VITAMIN D3) 1.25 MG (50000 UT) CAPS  08/13/19   [provider]  hydrochlorothiazide (HYDRODIURIL) 25 MG tablet Take 25 mg by mouth daily. 12/02/19   [provider]  predniSONE (STERAPRED UNI-PAK 21 TAB) 10 MG (21) TBPK tablet Take by mouth daily. Take 6 tabs by mouth daily  for 2 days, then 5 tabs for 2 days, then 4 tabs for 2 days, then 3 tabs for 2 days, 2 tabs for 2 days, then 1 tab by mouth daily for 2 days 03/13/21   Ward, Tylene Fantasia, PA-C    Family History Family History  Problem Relation Age of Onset   Hypertension Mother    Hypertension Father    Diabetes Father     Social History Social History   Tobacco Use   Smoking status: Never   Smokeless tobacco: Never  Substance Use Topics   Drug use: Never     Allergies   Augmentin [amoxicillin-pot clavulanate] and Penicillins   Review of Systems Review of Systems  All other systems reviewed and are negative.   Physical Exam Triage Vital Signs ED Triage Vitals  Enc Vitals Group      BP 04/10/21 0813 (!) 156/105     Pulse Rate 04/10/21 0809 90     Resp 04/10/21 0809 16     Temp 04/10/21 0809 98.7 F (37.1 C)     Temp Source 04/10/21 0809 Oral     SpO2 04/10/21 0809 98 %     Weight --      Height --      Head Circumference --      Peak Flow --      Pain Score 04/10/21 0811 0     Pain Loc --      Pain Edu? --      Excl. in GC? --    No data found.  Updated Vital Signs BP (!) 156/105 (BP Location: Right Arm) Comment: states she has not taken her BP meds this morning  Pulse 90   Temp 98.7 F (37.1 C) (Oral)   Resp 16   SpO2 98%   Visual Acuity Right Eye Distance:   Left Eye Distance:   Bilateral Distance:    Right Eye Near:   Left Eye Near:    Bilateral Near:     Physical Exam Vitals and nursing note reviewed.  Constitutional:      General: She is not  in acute distress. HENT:     Right Ear: Tympanic membrane normal.     Left Ear: Tympanic membrane normal.  Cardiovascular:     Rate and Rhythm: Normal rate and regular rhythm.  Neurological:     Mental Status: She is alert.     UC Treatments / Results  Labs (all labs ordered are listed, but only abnormal results are displayed) Labs Reviewed  HIV ANTIBODY (ROUTINE TESTING W REFLEX)  RPR  CERVICOVAGINAL ANCILLARY ONLY    EKG   Radiology No results found.  Procedures Procedures (including critical care time)  Medications Ordered in UC Medications - No data to display  Initial Impression / Assessment and Plan / UC Course  I have reviewed the triage vital signs and the nursing notes.  Pertinent labs & imaging results that were available during my care of the patient were reviewed by me and considered in my medical decision making (see chart for details).     1.  STD screening: Cervical vaginal swab for GC/chlamydia/trichomonas HIV, RPR We will call you with recommendations if labs are abnormal.  Final Clinical Impressions(s) / UC Diagnoses   Final diagnoses:  Screen for STD  (sexually transmitted disease)     Discharge Instructions      We will call with recommendations if labs are abnormal.   ED Prescriptions   None    PDMP not reviewed this encounter.   Merrilee Jansky, MD 04/10/21 1004

## 2021-04-10 NOTE — ED Triage Notes (Signed)
Wants an STD test.  No discharge or pain.

## 2021-04-14 LAB — HIV ANTIBODY (ROUTINE TESTING W REFLEX): HIV Screen 4th Generation wRfx: NONREACTIVE

## 2021-04-14 LAB — RPR: RPR Ser Ql: NONREACTIVE

## 2021-04-15 LAB — CERVICOVAGINAL ANCILLARY ONLY
Bacterial Vaginitis (gardnerella): NEGATIVE
Chlamydia: NEGATIVE
Comment: NEGATIVE
Comment: NEGATIVE
Comment: NEGATIVE
Comment: NORMAL
Neisseria Gonorrhea: NEGATIVE
Trichomonas: NEGATIVE

## 2021-08-12 DIAGNOSIS — G4733 Obstructive sleep apnea (adult) (pediatric): Secondary | ICD-10-CM | POA: Diagnosis not present

## 2021-09-12 DIAGNOSIS — G4733 Obstructive sleep apnea (adult) (pediatric): Secondary | ICD-10-CM | POA: Diagnosis not present

## 2021-10-10 DIAGNOSIS — G4733 Obstructive sleep apnea (adult) (pediatric): Secondary | ICD-10-CM | POA: Diagnosis not present

## 2021-11-10 DIAGNOSIS — G4733 Obstructive sleep apnea (adult) (pediatric): Secondary | ICD-10-CM | POA: Diagnosis not present

## 2021-11-19 DIAGNOSIS — G4733 Obstructive sleep apnea (adult) (pediatric): Secondary | ICD-10-CM | POA: Diagnosis not present

## 2021-11-29 IMAGING — US US PELVIS COMPLETE
1 series · 13 of 25 positions shown · non-contrast
Comparison: 03/24/2015

CLINICAL DATA: PCOS follow-up.  Bleeding for 1 month.

EXAM:
TRANSABDOMINAL ULTRASOUND OF PELVIS
TECHNIQUE: Transabdominal ultrasound examination of the pelvis was performed
including evaluation of the uterus, ovaries, adnexal regions, and
pelvic cul-de-sac.

[Series 1: gyn us · 13 of 65 slices shown]
[im 1/65]
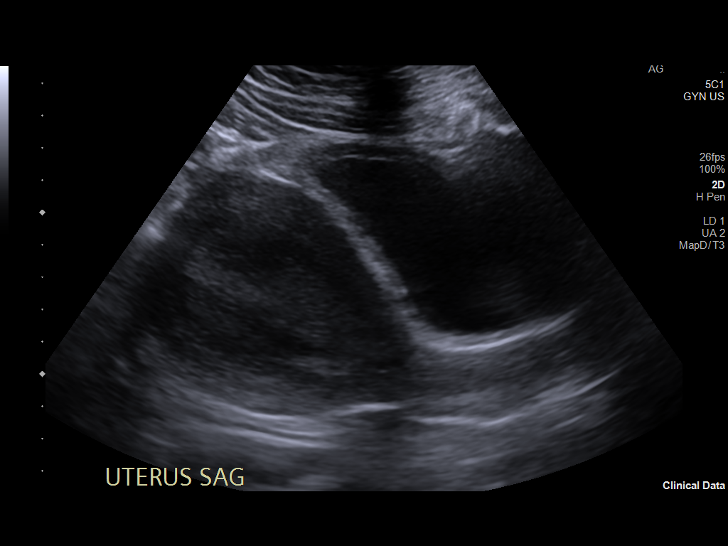
[im 6/65]
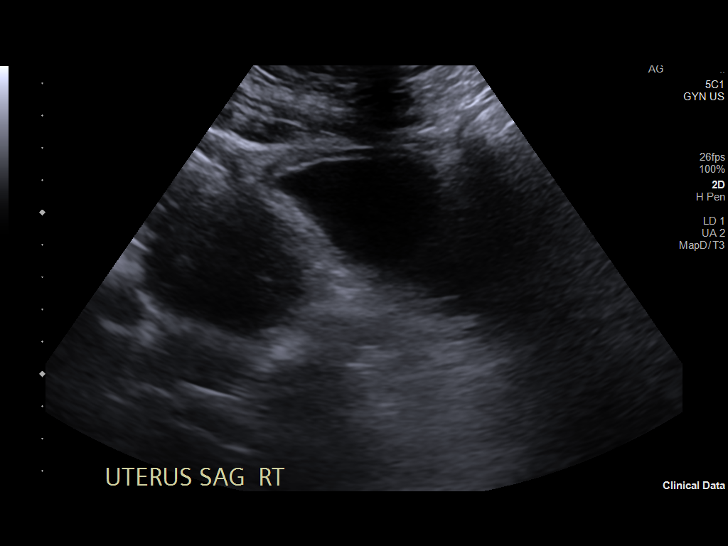
[im 11/65]
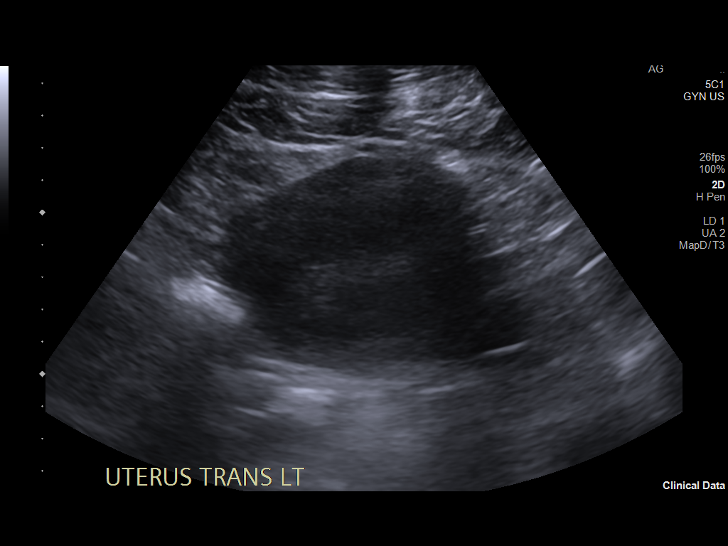
[im 17/65]
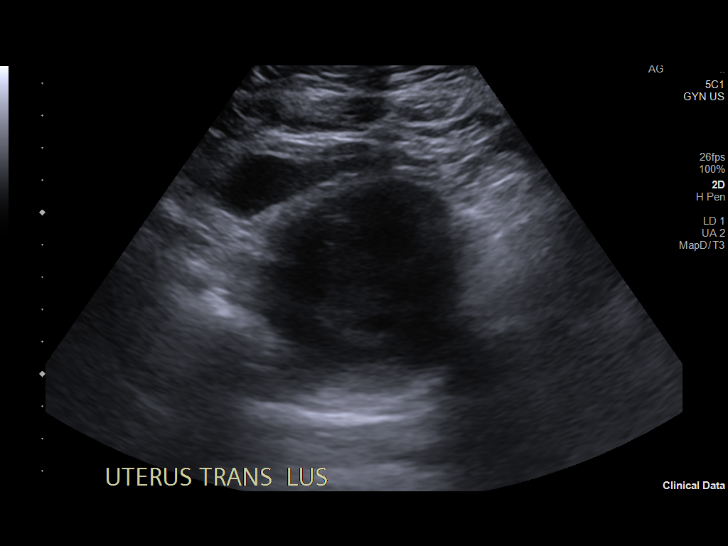
[im 22/65]
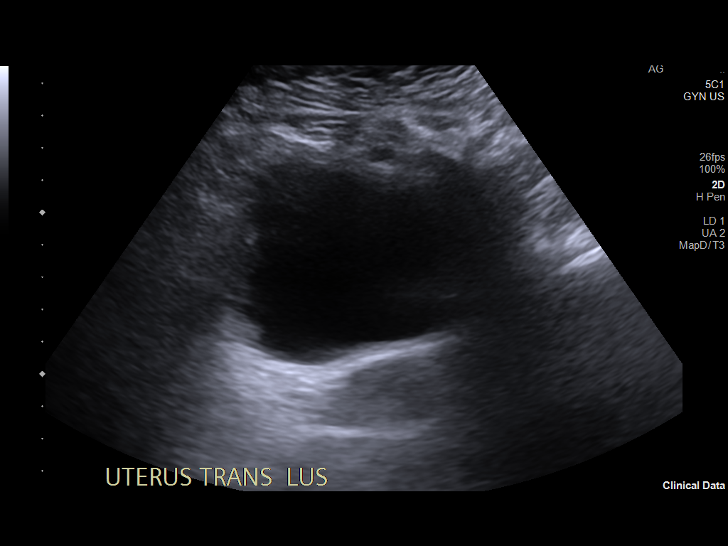
[im 27/65]
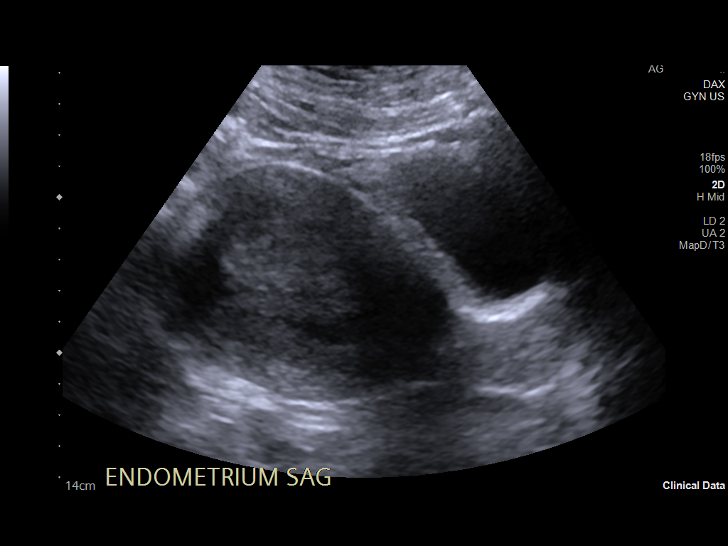
[im 33/65]
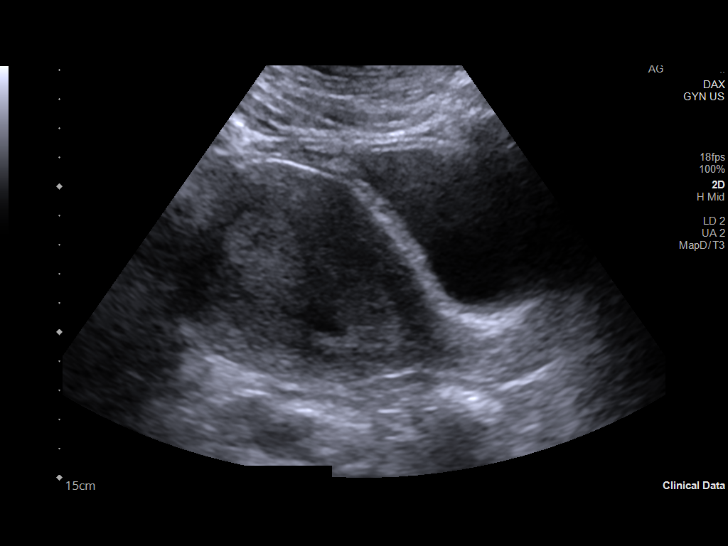
[im 38/65]
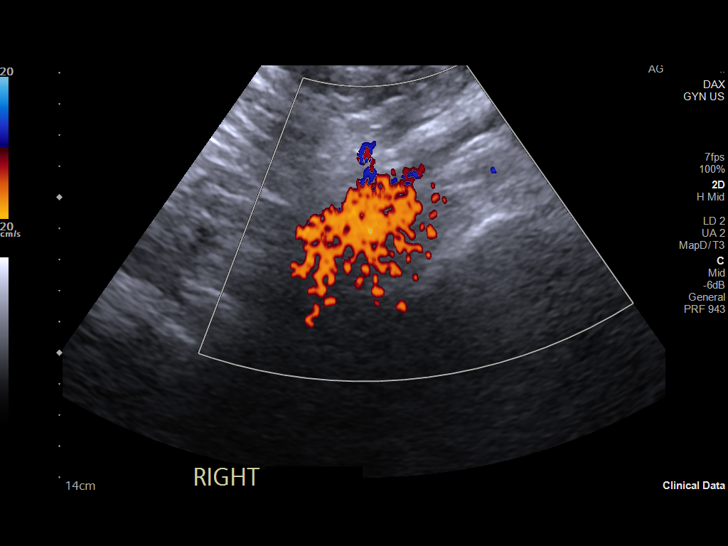
[im 43/65]
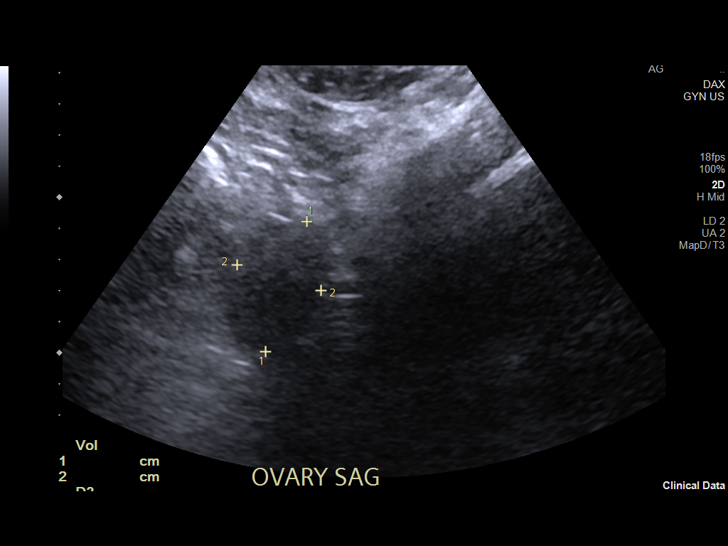
[im 49/65]
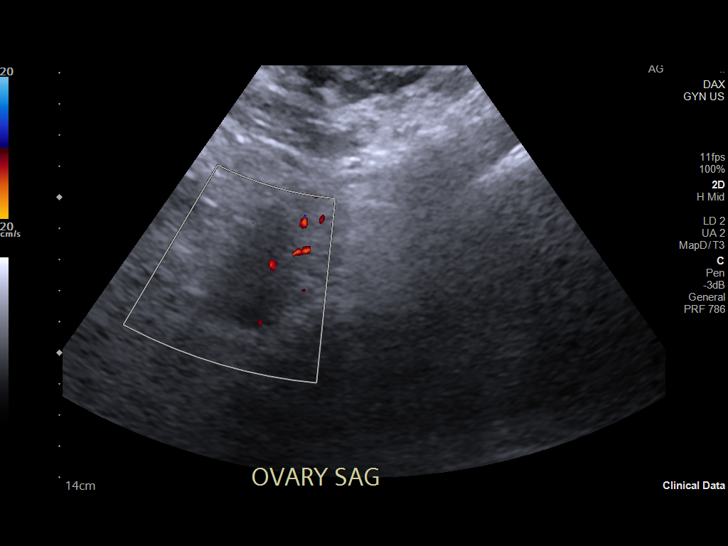
[im 54/65]
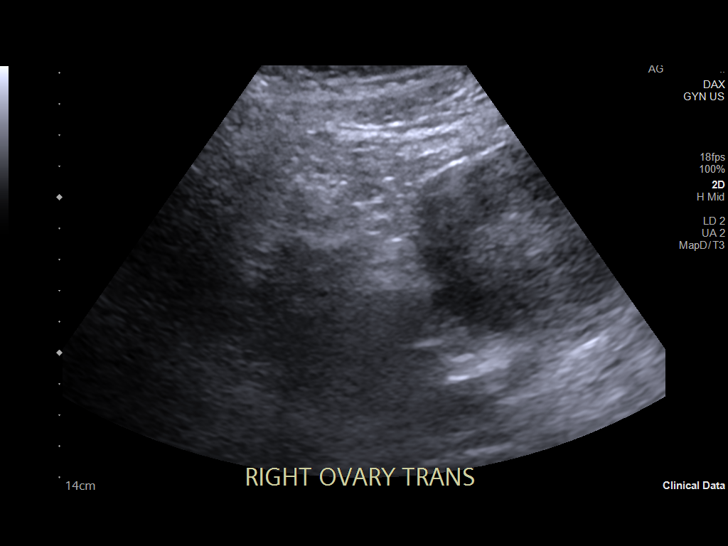
[im 59/65]
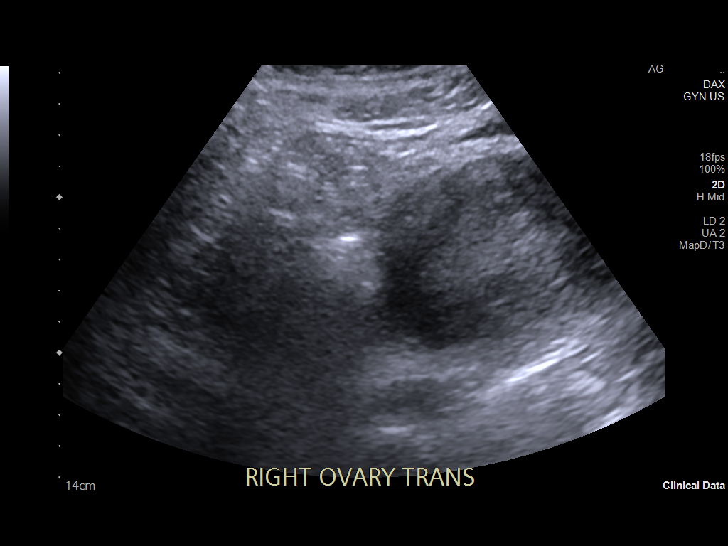
[im 65/65]
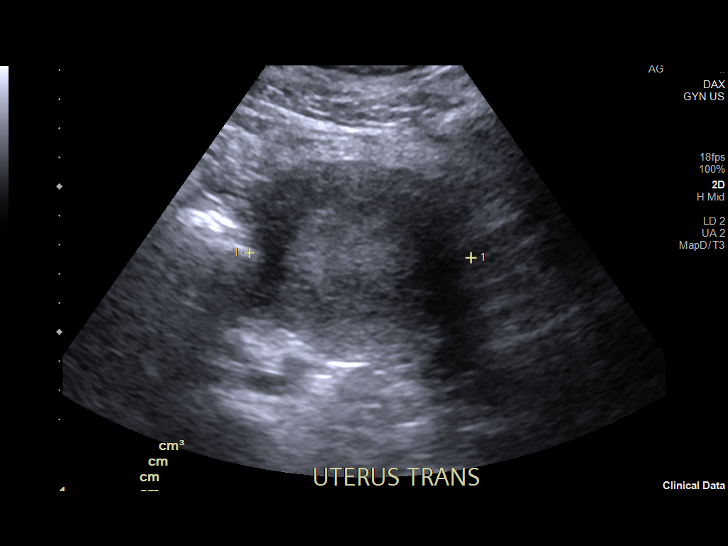

[13 of 25 positions shown; findings below may reference images not displayed]

FINDINGS: Uterus

Measurements: 13.5 x 7.3 x 7.6 centimeter = volume: 390.5 mL.
Anteverted and normal in appearance.

Endometrium

Thickness: 26 millimeters.  No focal abnormality visualized.

Right ovary

Measurements: The ovary is not visualized, either absent or
obscured.

Left ovary

Measurements: 4.4 x 2.8 x 3.0 centimeters = volume: 19.3 mL. Normal
appearance/no adnexal mass.

Other findings:  No abnormal free fluid.

Study quality is degraded by patient body habitus.Transvaginal
imaging is not performed as patient is not sexually active.
IMPRESSION: 1. Thickened endometrial stripe, 26 millimeters. If bleeding remains
unresponsive to hormonal or medical therapy, focal lesion work-up
with sonohysterogram should be considered. Endometrial biopsy should
also be considered in pre-menopausal patients at high risk for
endometrial carcinoma. (Ref: Radiological Reasoning: Algorithmic
Workup of Abnormal Vaginal Bleeding with Endovaginal Sonography and
Sonohysterography. AJR 6551; 191:S68-73)
2. Normal appearance of the LEFT ovary; nonvisualized RIGHT ovary.

## 2021-12-19 DIAGNOSIS — G4733 Obstructive sleep apnea (adult) (pediatric): Secondary | ICD-10-CM | POA: Diagnosis not present

## 2022-01-19 DIAGNOSIS — G4733 Obstructive sleep apnea (adult) (pediatric): Secondary | ICD-10-CM | POA: Diagnosis not present

## 2022-02-25 DIAGNOSIS — G4733 Obstructive sleep apnea (adult) (pediatric): Secondary | ICD-10-CM | POA: Diagnosis not present

## 2022-03-28 DIAGNOSIS — G4733 Obstructive sleep apnea (adult) (pediatric): Secondary | ICD-10-CM | POA: Diagnosis not present

## 2022-04-28 DIAGNOSIS — G4733 Obstructive sleep apnea (adult) (pediatric): Secondary | ICD-10-CM | POA: Diagnosis not present

## 2022-05-27 ENCOUNTER — Encounter: Payer: Self-pay | Admitting: Internal Medicine

## 2022-05-27 ENCOUNTER — Ambulatory Visit: Payer: BLUE CROSS/BLUE SHIELD | Admitting: Internal Medicine

## 2022-05-27 VITALS — BP 134/70 | HR 88 | Ht 62.0 in | Wt 370.0 lb

## 2022-05-27 DIAGNOSIS — R7303 Prediabetes: Secondary | ICD-10-CM | POA: Diagnosis not present

## 2022-05-27 DIAGNOSIS — L680 Hirsutism: Secondary | ICD-10-CM | POA: Diagnosis not present

## 2022-05-27 DIAGNOSIS — E282 Polycystic ovarian syndrome: Secondary | ICD-10-CM

## 2022-05-27 DIAGNOSIS — N911 Secondary amenorrhea: Secondary | ICD-10-CM

## 2022-05-27 NOTE — Progress Notes (Unsigned)
Name: Amanda Herrera  MRN/ DOB: [id number], March 29, 1986    Age/ Sex: 36 y.o., female    PCP: Ginger Organ   Reason for Endocrinology Evaluation: PCOS     Date of Initial Endocrinology Evaluation: 05/27/2022     HPI: Ms. Amanda Herrera is a 36 y.o. female with a past medical history of PCOS , Hx of prediabetes . The patient presented for initial endocrinology clinic visit on 05/27/2022 for consultative assistance with her PCOS.    The patient indicates that she was first diagnosed with PCOS in at age 33   Menarch 23 , historically she has had irregular menstruation, she was seen by GYN approximately 2 years ago and was noted with thickened endometrium.  She has chronic hirsutism , stable in nature over the face, thigh, back, nipples, back and chest    Has skin tags and acanthosis nigricans    Now sees Dr . Garwin Brothers , up  to date on pap smear  Was on OCP's in 2022 but due to heavy bleeding it was stopped 06/2021 follows by normal menstruations until March when it stopped    The patient has experienced metabolic issues including obesity ,  insulin resistance  She has mood swings , was seeing a therapist, no dx of eating disorder but she has depression   She was put on Metformin and levothyroxine through [facility] clinic until she lost her insurance   Metformin made her sick , has been berberine    She denies clinical features of Cushing's such as purple striae, but has central obesity. She has OSA on CPAP  dx summer in 2022   She denies  have family history of PCOS, thyroid disease  , ovarian cancer.     HISTORY:  Past Medical History:  Past Medical History:  Diagnosis Date   Hypertension    Past Surgical History:  Past Surgical History:  Procedure Laterality Date   HIP SURGERY      Social History:  reports that she has never smoked. She has never used smokeless tobacco. She reports that she does not use drugs. Family History: family history includes  Diabetes in her father; Hypertension in her father and mother.   HOME MEDICATIONS: Allergies as of 05/27/2022       Reactions   Augmentin [amoxicillin-pot Clavulanate]    Penicillins         Medication List        Accurate as of May 27, 2022  3:33 PM. If you have any questions, ask your nurse or doctor.          STOP taking these medications    hydrochlorothiazide 25 MG tablet Commonly known as: HYDRODIURIL Stopped by: Dorita Sciara, MD   predniSONE 10 MG (21) Tbpk tablet Commonly known as: STERAPRED UNI-PAK 21 TAB Stopped by: Dorita Sciara, MD   Vitamin D3 1.25 MG (50000 UT) Caps Stopped by: Dorita Sciara, MD       TAKE these medications    albuterol 108 (90 Base) MCG/ACT inhaler Commonly known as: VENTOLIN HFA SMARTSIG:2 Puff(s) By Mouth Every 6 Hours   amLODipine 5 MG tablet Commonly known as: NORVASC Take 5 mg by mouth daily.          REVIEW OF SYSTEMS: A comprehensive ROS was conducted with the patient and is negative except as per HPI     OBJECTIVE:  VS: BP 134/70 (BP Location: Left Arm, Patient Position: Sitting, Cuff Size: Large)  Pulse 88   Ht 5\' 2"  (1.575 m)   Wt (!) 370 lb (167.8 kg)   SpO2 98%   BMI 67.67 kg/m    Wt Readings from Last 3 Encounters:  05/27/22 (!) 370 lb (167.8 kg)  02/04/20 (!) 361 lb (163.7 kg)  01/07/20 (!) 367 lb (166.5 kg)     EXAM: General: Pt appears well and is in NAD  Eyes: External eye exam normal without stare, lid lag or exophthalmos.  EOM intact.   Neck: General: Supple without adenopathy. Thyroid: Thyroid size normal.  No goiter or nodules appreciated.  Skin tags and acanthosis nigricans noted  Lungs: Clear with good BS bilat with no rales, rhonchi, or wheezes  Heart: Auscultation: RRR.  Abdomen: Normoactive bowel sounds, soft, nontender, without masses or organomegaly palpable  Extremities:  BL LE: No pretibial edema normal ROM and strength.  Skin: Ferriman-Gallwey  score 36  Mental Status: Judgment, insight: Intact Orientation: Oriented to time, place, and person Mood and affect: No depression, anxiety, or agitation     DATA REVIEWED: ***    ASSESSMENT/PLAN/RECOMMENDATIONS:   PCOS:  -The management of PCOS involves amelioration of hyperandrogenic symptoms, metabolic abnormalities , prevention of endometrial hyperplasia and carcinoma as well as contraception for those not pursuing pregnancy.  -We will proceed with 17 hydroxyprogesterone, ACTH, cortisol, 24-hour urinary cortisol, TFTs  2.  Hirsutism:  -This is severe -We will check DHEA-S, testosterone -We discussed that COC's will reduce testosterone levels and may help with hirsutism -We may consider spironolactone as a secondary agent   3.  Secondary amenorrhea:   -She was on OCPs but developed heavy bleeding until she stopped in November 2022 -She is under the care of Dr. December 2022 -We will proceed with prolactin, estradiol, FSH and LH   4.  Prediabetes:  -A1c today*** -She has been diagnosed with prediabetes in the past but her A1c has normalized -She is intolerant to metformin that was given to her through [facility] -She does not recall being on XR formulation of metformin -We will consider metformin XR 500 mg 1 tablet daily   5.  Morbid obesity:  -She is an emotional eater -She has not been exercising, she does snack during the day, she is does not drink sugar sweetened beverages -We discussed being on specific and a consistent diet such as Noom, weight watchers -I have encouraged brisk walking 20-30 minutes a day -She has not been on weight loss medications in the past  6.  Depression:   -This is the most important thing that she needs to focus on at the moment, I have encouraged her to reach out to her PCP for antidepressants that would not cause any weight gain -Unfortunately with uncontrolled depression she is not going to be motivated for exercise not to make  lifestyle changes and she will continue to eat emotionally when feeling down -Her priority is to treat her depression at this time  Follow-up in 4 months We will decide on final management plans after her labs are back  Signed electronically by: [facility], MD  [facility] Endocr[facility]  [facility] [address] [place] [place], [place] Phone: [phone] FAX: [phone]   CC: [phone] [address] [place] Phone: [phone] Fax: [phone]   Return to Endocrinology clinic as below: Future Appointments  Date Time Provider Department Center  10/10/2022  8:10 AM Keonte Daubenspeck, 12/10/2022, MD [facility] None

## 2022-05-28 LAB — FOLLICLE STIMULATING HORMONE: FSH: 5.9 m[IU]/mL

## 2022-05-28 LAB — HEMOGLOBIN A1C: eAG (mmol/L): 7.3 mmol/L

## 2022-05-30 ENCOUNTER — Other Ambulatory Visit: Payer: BLUE CROSS/BLUE SHIELD

## 2022-05-30 DIAGNOSIS — E282 Polycystic ovarian syndrome: Secondary | ICD-10-CM

## 2022-05-30 MED ORDER — TIRZEPATIDE 2.5 MG/0.5ML ~~LOC~~ SOAJ: 2.5000 mg | SUBCUTANEOUS | 3 refills | Status: DC

## 2022-05-30 NOTE — Progress Notes (Unsigned)
Total volume 2,300.  Started 05-28-22 at 10:30 am, ended 05-29-22 at 10:30 am.

## 2022-05-31 ENCOUNTER — Other Ambulatory Visit (HOSPITAL_COMMUNITY): Payer: Self-pay

## 2022-05-31 ENCOUNTER — Telehealth: Payer: Self-pay

## 2022-05-31 LAB — CORTISOL: Cortisol, Plasma: 11 ug/dL

## 2022-05-31 LAB — ACTH: C206 ACTH: 18 pg/mL (ref 6–50)

## 2022-05-31 NOTE — Telephone Encounter (Signed)
Received notification from [facility] regarding a prior authorization for Mounjaro 2.5mg /0.28ml.   Authorization has been submitted via CMM to [employer] and is pending.     Key: [id number]

## 2022-05-31 NOTE — Telephone Encounter (Signed)
Received a fax regarding Prior Authorization from [employer] for Mounjaro 2.5mg /3.1RX.   Authorization has been DENIED because the request doe not meet the definition of Medical Necessity found in the member's benefit booklet. The medication is approved for members with type 2 diabetes.   Key: [id number]  Denial letter scanned to profile.

## 2022-06-01 MED ORDER — NORETHINDRONE ACET-ETHINYL EST 1-20 MG-MCG PO TABS: 1.0000 | ORAL_TABLET | Freq: Every day | ORAL | 3 refills | Status: DC

## 2022-06-01 MED ORDER — SEMAGLUTIDE(0.25 OR 0.5MG/DOS) 2 MG/3ML ~~LOC~~ SOPN: 0.5000 mg | PEN_INJECTOR | SUBCUTANEOUS | 11 refills | Status: DC

## 2022-06-01 NOTE — Telephone Encounter (Signed)
Mychart message sent.

## 2022-06-02 ENCOUNTER — Other Ambulatory Visit (HOSPITAL_COMMUNITY): Payer: Self-pay

## 2022-06-02 ENCOUNTER — Telehealth: Payer: Self-pay

## 2022-06-02 LAB — TESTOSTERONE, TOTAL, LC/MS/MS: Testosterone, Total, LC-MS-MS: 51 ng/dL — ABNORMAL HIGH (ref 2–45)

## 2022-06-02 LAB — COMPREHENSIVE METABOLIC PANEL
AG Ratio: 1.3 (calc) (ref 1.0–2.5)
ALT: 12 U/L (ref 6–29)
AST: 13 U/L (ref 10–30)
Albumin: 4.3 g/dL (ref 3.6–5.1)
Alkaline phosphatase (APISO): 62 U/L (ref 31–125)
BUN: 10 mg/dL (ref 7–25)
CO2: 24 mmol/L (ref 20–32)
Calcium: 9.4 mg/dL (ref 8.6–10.2)
Chloride: 102 mmol/L (ref 98–110)
Creat: 0.77 mg/dL (ref 0.50–0.97)
Globulin: 3.3 g/dL (calc) (ref 1.9–3.7)
Glucose, Bld: 74 mg/dL (ref 65–99)
Potassium: 3.7 mmol/L (ref 3.5–5.3)
Sodium: 138 mmol/L (ref 135–146)
Total Bilirubin: 0.5 mg/dL (ref 0.2–1.2)
Total Protein: 7.6 g/dL (ref 6.1–8.1)

## 2022-06-02 LAB — 17-HYDROXYPROGESTERONE: 17-OH-Progesterone, LC/MS/MS: 45 ng/dL

## 2022-06-02 LAB — DHEA-SULFATE: DHEA-SO4: 79 ug/dL (ref 19–237)

## 2022-06-02 LAB — HEMOGLOBIN A1C
Hgb A1c MFr Bld: 6.2 % of total Hgb — ABNORMAL HIGH (ref <5.7)
Mean Plasma Glucose: 131 mg/dL

## 2022-06-02 LAB — LIPID PANEL
Cholesterol: 189 mg/dL (ref <200)
HDL: 41 mg/dL — ABNORMAL LOW (ref >=50)
LDL Cholesterol (Calc): 123 mg/dL (calc) — ABNORMAL HIGH
Non-HDL Cholesterol (Calc): 148 mg/dL (calc) — ABNORMAL HIGH (ref <130)
Total CHOL/HDL Ratio: 4.6 (calc) (ref <5.0)
Triglycerides: 142 mg/dL (ref <150)

## 2022-06-02 LAB — PROLACTIN: Prolactin: 5.5 ng/mL

## 2022-06-02 LAB — LUTEINIZING HORMONE: LH: 7.7 m[IU]/mL

## 2022-06-02 LAB — ESTRADIOL: Estradiol: 69 pg/mL

## 2022-06-02 NOTE — Telephone Encounter (Signed)
Pharmacy Patient Advocate Encounter   Received notification from [employer] that prior authorization for Ozempic (0.25 or 0.5 MG/DOSE) 2MG /3ML pen-injectors is required/requested.   PA submitted on 06/02/2022 to [employer] via CoverMyMeds Key [id number]  Status is pending

## 2022-06-06 LAB — CORTISOL, URINE, 24 HOUR
24 Hour urine volume (VMAHVA): 2300 mL
CREATININE, URINE: 2.26 g/(24.h) — ABNORMAL HIGH (ref 0.50–2.15)
Cortisol (Ur), Free: 28.8 mcg/24 h (ref 4.0–50.0)

## 2022-06-06 NOTE — Telephone Encounter (Signed)
Pharmacy Patient Advocate Encounter  Received notification from [facility] that the request for prior authorization for Ozempic (0.25 or 0.5 MG/DOSE) 2MG /3ML pen-injectors has been denied due to the request does not meet the definitionof Medical Necessity found in the member's benefit booklet.     The medication is approved for members with type 2 diabetes  Key: [id number]  Denial letter attached to patient's chart

## 2022-06-10 MED ORDER — METFORMIN HCL ER 500 MG PO TB24: 500.0000 mg | ORAL_TABLET | Freq: Every day | ORAL | 2 refills | Status: DC

## 2022-06-22 ENCOUNTER — Ambulatory Visit
Admission: EM | Admit: 2022-06-22 | Discharge: 2022-06-22 | Disposition: A | Discharge status: Home / Self Care | Payer: BLUE CROSS/BLUE SHIELD | Attending: Nurse Practitioner | Admitting: Nurse Practitioner

## 2022-06-22 DIAGNOSIS — N939 Abnormal uterine and vaginal bleeding, unspecified: Secondary | ICD-10-CM

## 2022-06-22 HISTORY — DX: Unspecified asthma, uncomplicated: J45.909

## 2022-06-22 HISTORY — DX: Polycystic ovarian syndrome: E28.2

## 2022-06-22 LAB — POCT URINE PREGNANCY: Preg Test, Ur: NEGATIVE

## 2022-06-22 MED ORDER — TRANEXAMIC ACID 650 MG PO TABS: 1300.0000 mg | ORAL_TABLET | Freq: Three times a day (TID) | ORAL | 0 refills | Status: AC

## 2022-06-22 NOTE — ED Triage Notes (Signed)
Pt states heavy period for the past 3 weeks. Has a history of PCOS and has had problems with her period in the past.

## 2022-06-22 NOTE — Discharge Instructions (Addendum)
The urine pregnancy test was negative. Take medication as prescribed. As discussed, it is important for you to follow-up with your gynecologist if symptoms do not improve with this medication.  Also recommend following up with your endocrinologist since starting the metformin for further evaluation of your PCOS. By a gynecologist, please let them know you are being treated for PCOS by the your endocrinologist. If you begin saturating more than 1 pad an hour or cannot control your bleeding, please go to the emergency department immediately for further evaluation. May take over-the-counter Tylenol as needed for abdominal pain or cramping. Follow-up as needed.

## 2022-06-22 NOTE — ED Provider Notes (Signed)
[facility]    CSN: [id number] Arrival date & time: 06/22/22  3419      History   Chief Complaint Chief Complaint  Patient presents with   Menorrhagia    HPI Amanda Herrera is a 36 y.o. female.   The history is provided by the patient.   She presents for complaints of abnormal vaginal bleeding.  Symptoms have been present for the last 2 weeks, but have since worsened over the last 2 to 3 days.  Patient reports that she was started on norethindrone for her abnormal bleeding by the endocrinologist approximately 2 weeks ago.  States that she told her endocrinologist symptoms were not improving and was then informed to stop the medication.  Patient states that she stopped the medication she has began having heavy clotting, with symptoms worsening at night.  Patient has a history of PCOS and thickened endometrial lining.  She states that she has been on norethindrone before, she is why the medication was restarted.  Patient was recently prescribed metformin by her endocrinologist for PCOS.  Patient denies fever, chills, nausea, vomiting, or urinary symptoms.  Patient reports that she has had increased abdominal cramping since her symptoms started.  She states that she is unable to see her gynecologist because her insurance changed.  She is stating that she is planning on's make an appointment for a another gynecologist . Past Medical History:  Diagnosis Date   Asthma    Hypertension    PCOS (polycystic ovarian syndrome)     There are no problems to display for this patient.   Past Surgical History:  Procedure Laterality Date   HIP SURGERY      OB History   No obstetric history on file.      Home Medications    Prior to Admission medications   Medication Sig Start Date End Date Taking? Authorizing Provider  tranexamic acid (LYSTEDA) 650 MG TABS tablet Take 2 tablets (1,300 mg total) by mouth 3 (three) times daily for 5 days. 06/22/22 06/27/22 Yes Zymere Patlan-Warren,  Sadie Haber, NP  albuterol (VENTOLIN HFA) 108 (90 Base) MCG/ACT inhaler SMARTSIG:2 Puff(s) By Mouth Every 6 Hours 08/16/19   [provider]  amLODipine (NORVASC) 5 MG tablet Take 5 mg by mouth daily. 12/02/19   [provider]  metFORMIN (GLUCOPHAGE-XR) 500 MG 24 hr tablet Take 1 tablet (500 mg total) by mouth daily. 06/10/22   Shamleffer, Konrad Dolores, MD  norethindrone-ethinyl estradiol (LOESTRIN 1/20, 21,) 1-20 MG-MCG tablet Take 1 tablet by mouth daily. 06/01/22   Shamleffer, Konrad Dolores, MD  Semaglutide,0.25 or 0.5MG /DOS, 2 MG/3ML SOPN Inject 0.5 mg into the skin once a week. 06/01/22   Shamleffer, Konrad Dolores, MD    Family History Family History  Problem Relation Age of Onset   Hypertension Mother    Hypertension Father    Diabetes Father     Social History Social History   Tobacco Use   Smoking status: Never   Smokeless tobacco: Never  Substance Use Topics   Drug use: Never     Allergies   Augmentin [amoxicillin-pot clavulanate] and Penicillins   Review of Systems Review of Systems Per HPI  Physical Exam Triage Vital Signs ED Triage Vitals  Enc Vitals Group     BP 06/22/22 1030 (!) 177/129     Pulse Rate 06/22/22 1025 85     Resp 06/22/22 1025 16     Temp 06/22/22 1025 98.1 F (36.7 C)     Temp Source  06/22/22 1025 Oral     SpO2 06/22/22 1025 98 %     Weight --      Height --      Head Circumference --      Peak Flow --      Pain Score 06/22/22 1025 0     Pain Loc --      Pain Edu? --      Excl. in GC? --    No data found.  Updated Vital Signs BP (!) 177/129 (BP Location: Right Arm)   Pulse 85   Temp 98.1 F (36.7 C) (Oral)   Resp 16   LMP 06/01/2022 (Approximate)   SpO2 98%   Visual Acuity Right Eye Distance:   Left Eye Distance:   Bilateral Distance:    Right Eye Near:   Left Eye Near:    Bilateral Near:     Physical Exam Vitals and nursing note reviewed.  Constitutional:      General: She is not in acute  distress.    Appearance: Normal appearance.  HENT:     Head: Normocephalic.  Eyes:     Extraocular Movements: Extraocular movements intact.     Pupils: Pupils are equal, round, and reactive to light.  Cardiovascular:     Rate and Rhythm: Normal rate and regular rhythm.     Pulses: Normal pulses.     Heart sounds: Normal heart sounds.  Pulmonary:     Effort: Pulmonary effort is normal. No respiratory distress.     Breath sounds: Normal breath sounds. No stridor. No wheezing, rhonchi or rales.  Abdominal:     General: Bowel sounds are normal.     Palpations: Abdomen is soft.     Tenderness: There is no abdominal tenderness. There is no guarding.  Genitourinary:    Comments: GU exam deferred Musculoskeletal:     Cervical back: Normal range of motion.  Skin:    General: Skin is warm and dry.  Neurological:     General: No focal deficit present.     Mental Status: She is alert and oriented to person, place, and time.  Psychiatric:        Mood and Affect: Mood normal.      UC Treatments / Results  Labs (all labs ordered are listed, but only abnormal results are displayed) Labs Reviewed  POCT URINE PREGNANCY    EKG   Radiology No results found.  Procedures Procedures (including critical care time)  Medications Ordered in UC Medications - No data to display  Initial Impression / Assessment and Plan / UC Course  I have reviewed the triage vital signs and the nursing notes.  Pertinent labs & imaging results that were available during my care of the patient were reviewed by me and considered in my medical decision making (see chart for details).  Patient presents for complaints of vaginal bleeding that been present for the past 2 weeks.  Symptoms of worsened over the past 2 to 3 days.  On exam, patient is hypertensive.  She is otherwise stable, and is in no acute distress.  Given her lengthy history of PCOS and thickened endometrial lining, it appears symptoms are caused  by these conditions.  In the interim, will start patient on Lysteda 1.3 mg 3 times daily for vaginal bleeding.  Patient was given strict indications of when to go to the emergency department.  Informed patient that it is recommended that she follow-up with gynecology for further evaluation.  Patient verbalizes understanding.  All questions were answered.  Patient is stable for discharge.  Abnormal vaginal bleeding  Lysteda 1.3 mg 3 times daily for 5 days. Continue metformin at this time. Recommend following up with your gynecologist for further evaluation and work-up. Go to the emergency department immediately if you begin saturating more than 1 pad an hour, feeling lightheaded, dizzy, or other concerns. Follow-up as needed. Final Clinical Impressions(s) / UC Diagnoses   Final diagnoses:  Abnormal vaginal bleeding     Discharge Instructions      The urine pregnancy test was negative. Take medication as prescribed. As discussed, it is important for you to follow-up with your gynecologist if symptoms do not improve with this medication.  Also recommend following up with your endocrinologist since starting the metformin for further evaluation of your PCOS. By a gynecologist, please let them know you are being treated for PCOS by the your endocrinologist. If you begin saturating more than 1 pad an hour or cannot control your bleeding, please go to the emergency department immediately for further evaluation. May take over-the-counter Tylenol as needed for abdominal pain or cramping. Follow-up as needed.      ED Prescriptions     Medication Sig Dispense Auth. Provider   tranexamic acid (LYSTEDA) 650 MG TABS tablet Take 2 tablets (1,300 mg total) by mouth 3 (three) times daily for 5 days. 30 tablet Faolan Springfield-Warren, Sadie Haber, NP      PDMP not reviewed this encounter.   Abran Cantor, NP 06/22/22 1242

## 2022-09-06 IMAGING — DX DG CHEST 2V
2 series · 2 of 2 positions shown · non-contrast
Comparison: 10/14/2008

CLINICAL DATA: Shortness of breath, fever.

EXAM:
CHEST - 2 VIEW

[chest pa]
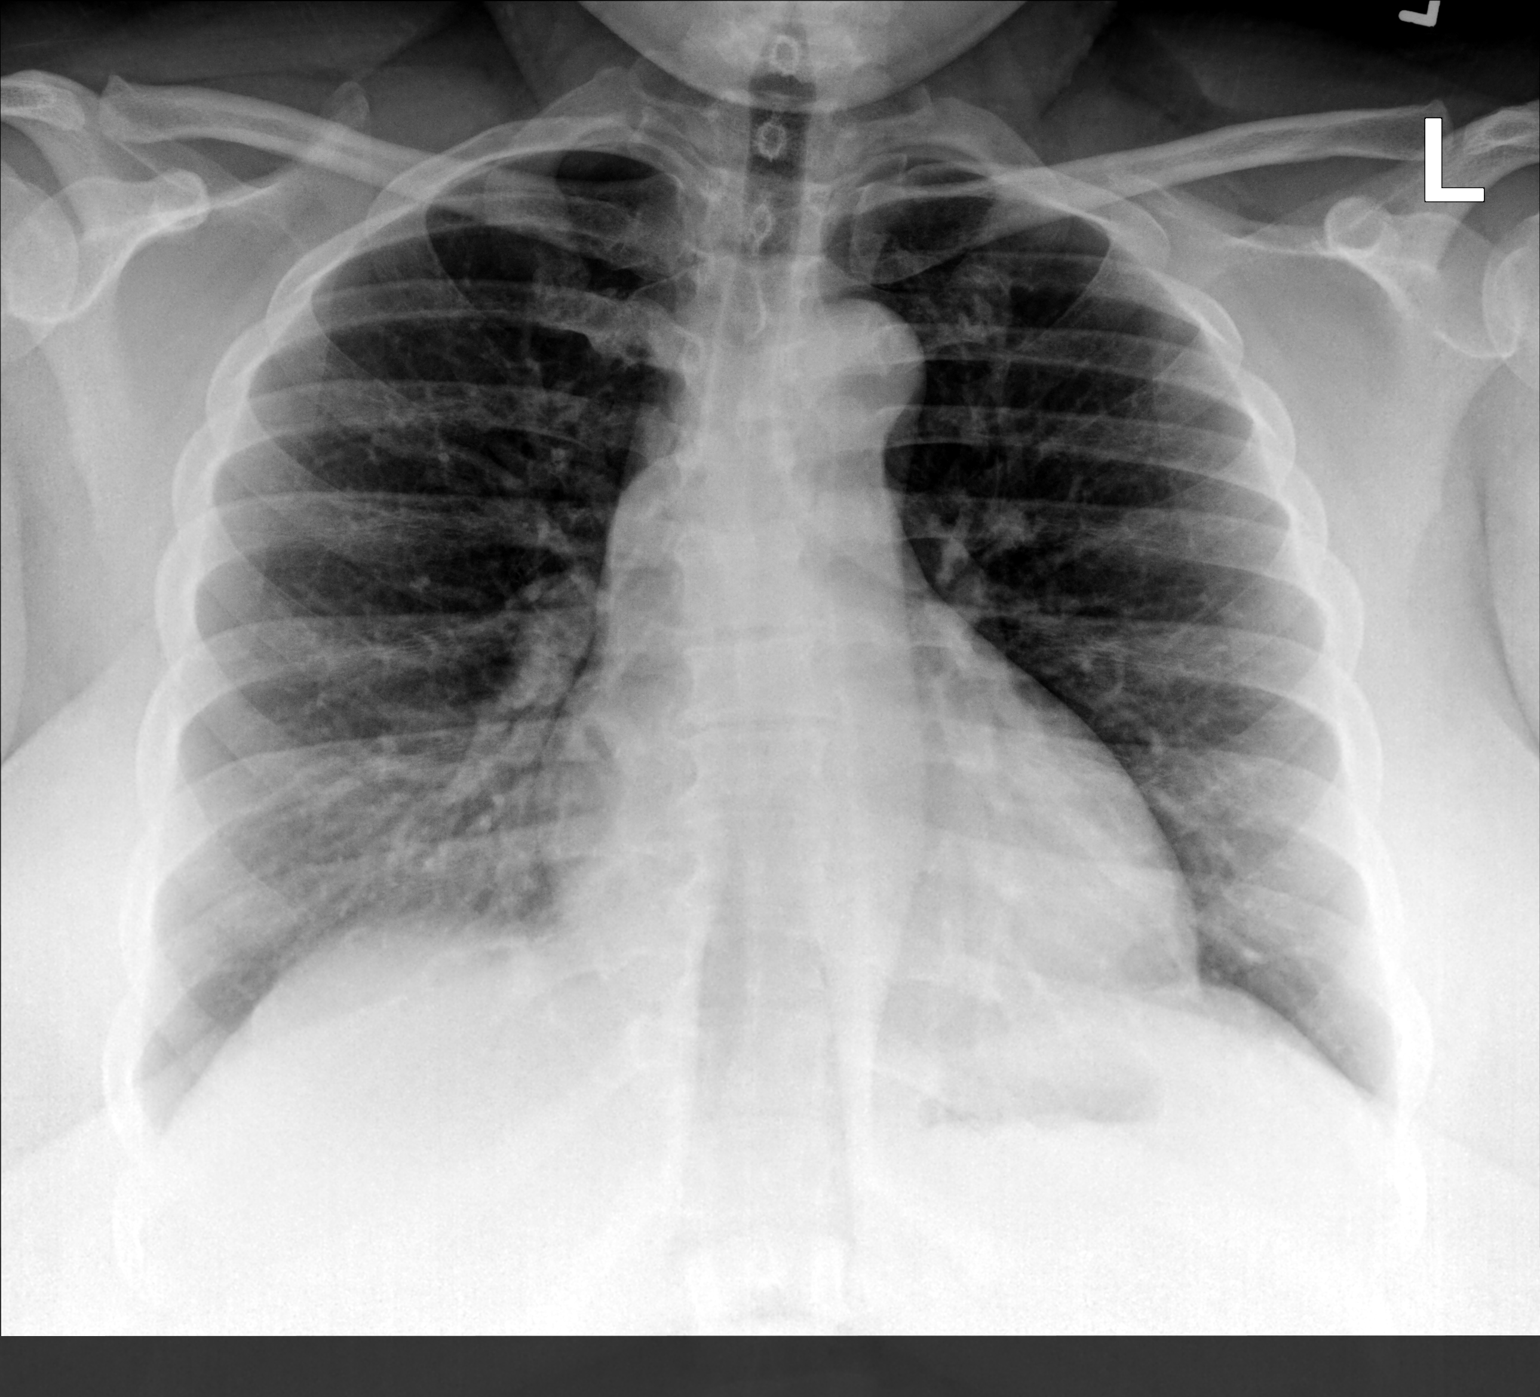

[chest lat]
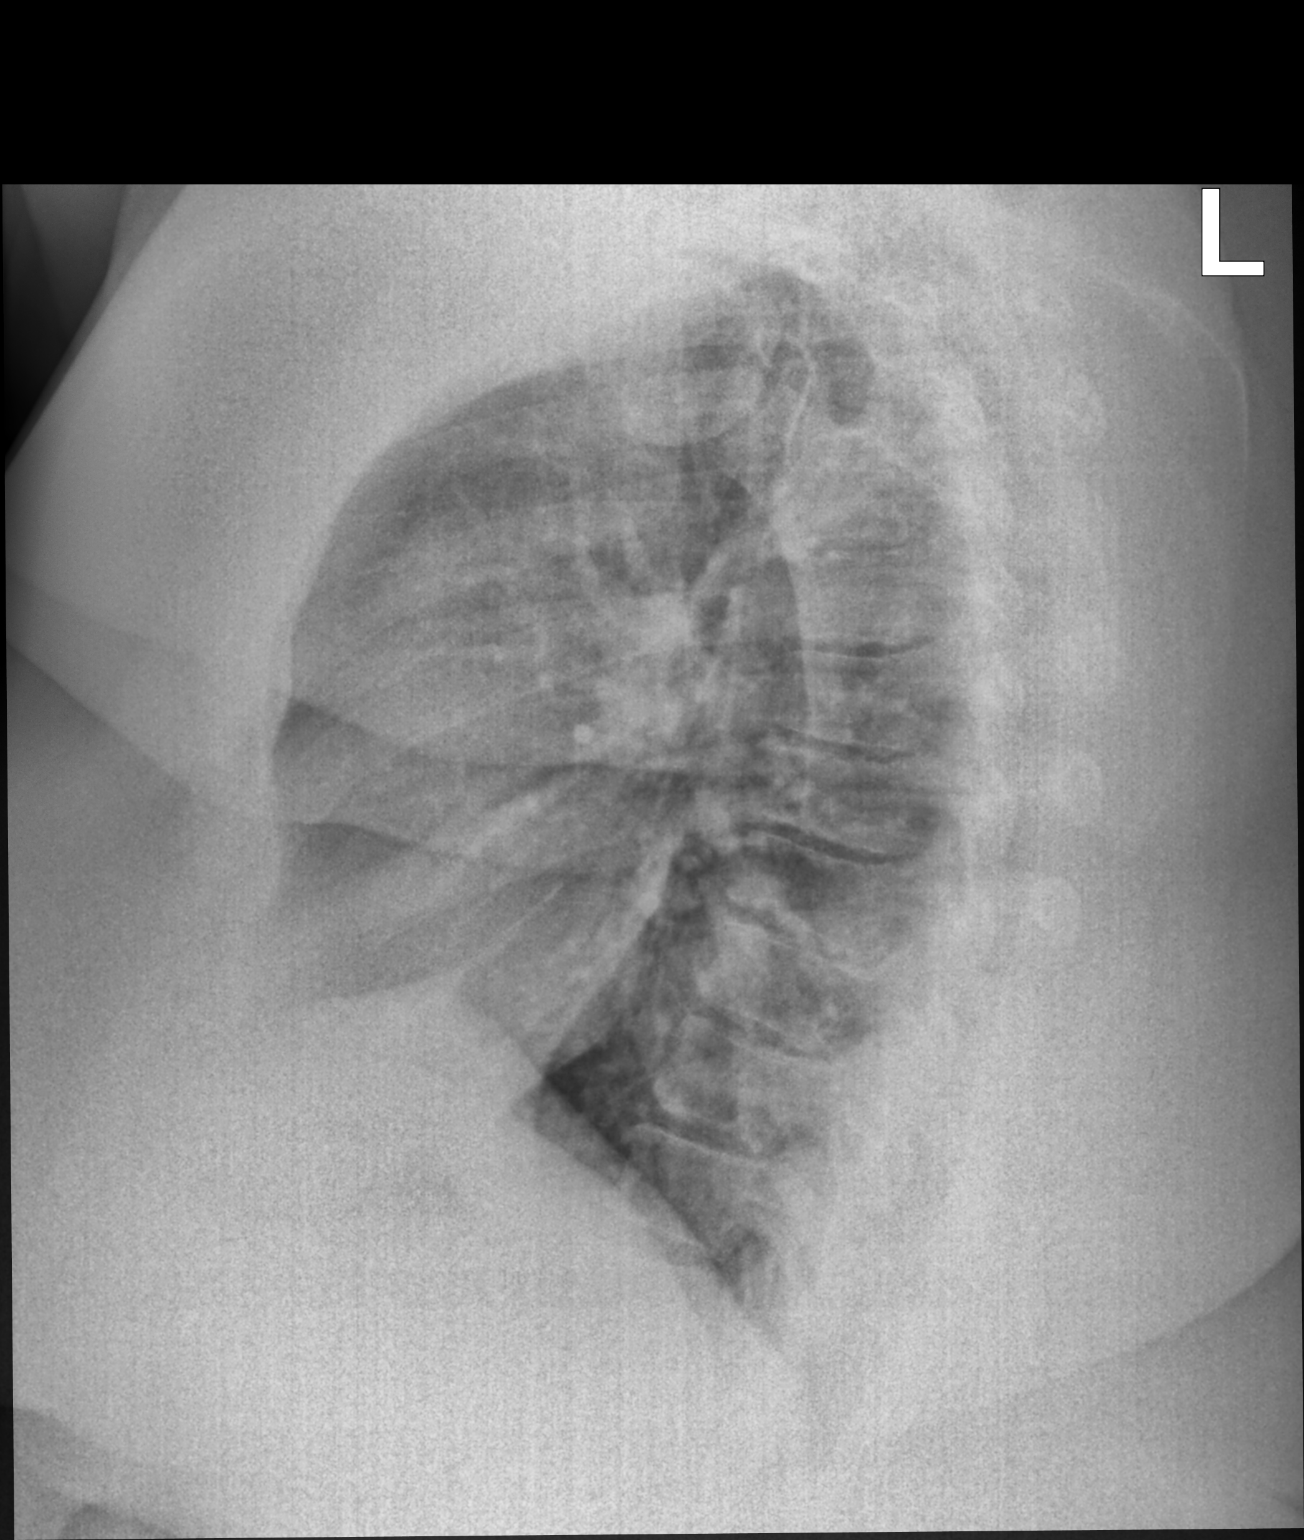

[2 of 2 positions shown; findings below may reference images not displayed]

FINDINGS: Both lungs are clear. Heart and mediastinum are within normal
limits. Trachea is midline. Negative for a pneumothorax. No acute
bone abnormality.
IMPRESSION: No active cardiopulmonary disease.

## 2022-09-07 ENCOUNTER — Other Ambulatory Visit: Payer: Self-pay

## 2022-09-07 ENCOUNTER — Ambulatory Visit
Admission: EM | Admit: 2022-09-07 | Discharge: 2022-09-07 | Disposition: A | Discharge status: Home / Self Care | Payer: BLUE CROSS/BLUE SHIELD | Attending: Family Medicine | Admitting: Family Medicine

## 2022-09-07 ENCOUNTER — Encounter: Payer: Self-pay | Admitting: Emergency Medicine

## 2022-09-07 DIAGNOSIS — J309 Allergic rhinitis, unspecified: Secondary | ICD-10-CM

## 2022-09-07 DIAGNOSIS — J4521 Mild intermittent asthma with (acute) exacerbation: Secondary | ICD-10-CM | POA: Diagnosis not present

## 2022-09-07 DIAGNOSIS — L02212 Cutaneous abscess of back [any part, except buttock]: Secondary | ICD-10-CM

## 2022-09-07 MED ORDER — PREDNISONE 20 MG PO TABS: 40.0000 mg | ORAL_TABLET | Freq: Every day | ORAL | 0 refills | Status: DC

## 2022-09-07 MED ORDER — CETIRIZINE HCL 10 MG PO TABS: 10.0000 mg | ORAL_TABLET | Freq: Every day | ORAL | 2 refills | Status: DC

## 2022-09-07 MED ORDER — FLUTICASONE PROPIONATE 50 MCG/ACT NA SUSP: 1.0000 | Freq: Two times a day (BID) | NASAL | 2 refills | Status: DC

## 2022-09-07 MED ORDER — SULFAMETHOXAZOLE-TRIMETHOPRIM 800-160 MG PO TABS: 1.0000 | ORAL_TABLET | Freq: Two times a day (BID) | ORAL | 0 refills | Status: AC

## 2022-09-07 NOTE — ED Triage Notes (Signed)
Pt reports nasal congestion, cough, sinus drainage,intermittent facial pressure x1 week. OTC medication and px inhaler has not helped with symptoms.

## 2022-09-07 NOTE — ED Provider Notes (Signed)
[facility]    CSN: [id number] Arrival date & time: 09/07/22  0800      History   Chief Complaint Chief Complaint  Patient presents with   Nasal Congestion    HPI Amanda Herrera is a 37 y.o. female.   Presenting today with about a week of runny nose, nasal congestion, sinus drainage, scratchy throat, productive cough, chest tightness, occasional wheezing.  Denies fever, chills, body aches, chest pain, shortness of breath, abdominal pain, nausea vomiting or diarrhea.  Has been taking her albuterol inhaler, the occasional Benadryl and over-the-counter Mucinex type medications with minimal relief.  History of seasonal allergies and asthma not on a consistent regimen.  No known new sick contacts recently. She is also complaining of a painful abscess forming to her left low back.  States is a recurrent issue for her and she always requires antibiotics.  Has been trying warm compresses with no relief.  No active drainage or bleeding.    Past Medical History:  Diagnosis Date   Asthma    Hypertension    PCOS (polycystic ovarian syndrome)     There are no problems to display for this patient.   Past Surgical History:  Procedure Laterality Date   HIP SURGERY      OB History   No obstetric history on file.      Home Medications    Prior to Admission medications   Medication Sig Start Date End Date Taking? Authorizing Provider  cetirizine (ZYRTEC ALLERGY) 10 MG tablet Take 1 tablet (10 mg total) by mouth daily. 09/07/22  Yes Volney American, PA-C  fluticasone [facility]) 50 MCG/ACT nasal spray Place 1 spray into both nostrils 2 (two) times daily. 09/07/22  Yes Volney American, PA-C  predniSONE (DELTASONE) 20 MG tablet Take 2 tablets (40 mg total) by mouth daily with breakfast. 09/07/22  Yes Volney American, PA-C  sulfamethoxazole-trimethoprim (BACTRIM DS) 800-160 MG tablet Take 1 tablet by mouth 2 (two) times daily for 7 days. 09/07/22 09/14/22 Yes  Volney American, PA-C  albuterol (VENTOLIN HFA) 108 (90 Base) MCG/ACT inhaler SMARTSIG:2 Puff(s) By Mouth Every 6 Hours 08/16/19   [provider]  amLODipine (NORVASC) 5 MG tablet Take 5 mg by mouth daily. 12/02/19   [provider]  metFORMIN (GLUCOPHAGE-XR) 500 MG 24 hr tablet Take 1 tablet (500 mg total) by mouth daily. 06/10/22   Shamleffer, Melanie Crazier, MD  norethindrone-ethinyl estradiol (LOESTRIN 1/20, 21,) 1-20 MG-MCG tablet Take 1 tablet by mouth daily. 06/01/22   Shamleffer, Melanie Crazier, MD  Semaglutide,0.25 or 0.5MG /DOS, 2 CH/8NI SOPN Inject 0.5 mg into the skin once a week. 06/01/22   Shamleffer, Melanie Crazier, MD    Family History Family History  Problem Relation Age of Onset   Hypertension Mother    Hypertension Father    Diabetes Father     Social History Social History   Tobacco Use   Smoking status: Never   Smokeless tobacco: Never  Substance Use Topics   Drug use: Never     Allergies   Augmentin [amoxicillin-pot clavulanate] and Penicillins   Review of Systems Review of Systems Per HPI  Physical Exam Triage Vital Signs ED Triage Vitals  Enc Vitals Group     BP 09/07/22 0819 (!) 168/120     Pulse Rate 09/07/22 0819 97     Resp 09/07/22 0819 18     Temp 09/07/22 0819 98.9 F (37.2 C)     Temp Source 09/07/22 0819 Oral  SpO2 09/07/22 0819 96 %     Weight --      Height --      Head Circumference --      Peak Flow --      Pain Score 09/07/22 0821 0     Pain Loc --      Pain Edu? --      Excl. in [place]? --    No data found.  Updated Vital Signs BP (!) 168/120 (BP Location: Right Arm)   Pulse 97   Temp 98.9 F (37.2 C) (Oral)   Resp 18   LMP 09/07/2022 Comment: irregular  SpO2 96%   Visual Acuity Right Eye Distance:   Left Eye Distance:   Bilateral Distance:    Right Eye Near:   Left Eye Near:    Bilateral Near:     Physical Exam Vitals and nursing note reviewed.  Constitutional:      Appearance:  Normal appearance.  HENT:     Head: Atraumatic.     Right Ear: Tympanic membrane and external ear normal.     Left Ear: Tympanic membrane and external ear normal.     Nose: Rhinorrhea present.     Mouth/Throat:     Mouth: Mucous membranes are moist.     Pharynx: Posterior oropharyngeal erythema present.  Eyes:     Extraocular Movements: Extraocular movements intact.     Conjunctiva/sclera: Conjunctivae normal.  Cardiovascular:     Rate and Rhythm: Normal rate and regular rhythm.     Heart sounds: Normal heart sounds.  Pulmonary:     Effort: Pulmonary effort is normal.     Breath sounds: Normal breath sounds. No wheezing or rales.  Musculoskeletal:        General: Normal range of motion.     Cervical back: Normal range of motion and neck supple.  Skin:    General: Skin is warm and dry.     Comments: Area of tenderness to palpation left lateral low back between the skin fold, no obvious erythema, fluctuance, induration, and no current drainage  Neurological:     Mental Status: She is alert and oriented to person, place, and time.  Psychiatric:        Mood and Affect: Mood normal.        Thought Content: Thought content normal.      UC Treatments / Results  Labs (all labs ordered are listed, but only abnormal results are displayed) Labs Reviewed - No data to display  EKG   Radiology No results found.  Procedures Procedures (including critical care time)  Medications Ordered in UC Medications - No data to display  Initial Impression / Assessment and Plan / UC Course  I have reviewed the triage vital signs and the nursing notes.  Pertinent labs & imaging results that were available during my care of the patient were reviewed by me and considered in my medical decision making (see chart for details).     Suspect seasonal allergy exacerbation causing asthma exacerbation.  Treat with prednisone, Zyrtec, Flonase consistently, discussed supportive over-the-counter  medications and home care additionally.  Regarding the abscess that she additionally brings up on her left lower back, does not appear red, swollen or fluctuant at this time so no indication for I&D and discussed likely not necessarily needing antibiotic at this time but she states she always ends up needing 1 so will prescribe 1 but recommended she not start it until things would progress to that point in  hopes that it will not.  Warm compresses, ibuprofen as needed. Final Clinical Impressions(s) / UC Diagnoses   Final diagnoses:  Allergic sinusitis  Abscess of back  Mild intermittent asthma with acute exacerbation     Discharge Instructions      Do not take the antibiotic right away, continue warm compresses, ibuprofen for pain and swelling of the area to your low back.  If the area does worsen you may start the antibiotic in addition.  Regarding your sinus symptoms, prednisone, consistent daily allergy regimen of Zyrtec and twice daily Flonase, sinus rinses several times daily, inhaler as needed.    ED Prescriptions     Medication Sig Dispense Auth. Provider   sulfamethoxazole-trimethoprim (BACTRIM DS) 800-160 MG tablet Take 1 tablet by mouth 2 (two) times daily for 7 days. 14 tablet Volney American, [place]   predniSONE (DELTASONE) 20 MG tablet Take 2 tablets (40 mg total) by mouth daily with breakfast. 10 tablet Volney American, PA-C   cetirizine (ZYRTEC ALLERGY) 10 MG tablet Take 1 tablet (10 mg total) by mouth daily. 30 tablet Volney American, PA-C   fluticasone [facility]) 50 MCG/ACT nasal spray Place 1 spray into both nostrils 2 (two) times daily. 16 g Volney American, [place]      PDMP not reviewed this encounter.   Volney American, [place] 09/07/22 1234

## 2022-09-07 NOTE — Discharge Instructions (Addendum)
Do not take the antibiotic right away, continue warm compresses, ibuprofen for pain and swelling of the area to your low back.  If the area does worsen you may start the antibiotic in addition.  Regarding your sinus symptoms, prednisone, consistent daily allergy regimen of Zyrtec and twice daily Flonase, sinus rinses several times daily, inhaler as needed.

## 2022-09-21 ENCOUNTER — Emergency Department (HOSPITAL_BASED_OUTPATIENT_CLINIC_OR_DEPARTMENT_OTHER)
Admission: EM | Admit: 2022-09-21 | Discharge: 2022-09-21 | Disposition: A | Discharge status: Home / Self Care | Payer: BLUE CROSS/BLUE SHIELD | Attending: Emergency Medicine | Admitting: Emergency Medicine

## 2022-09-21 ENCOUNTER — Other Ambulatory Visit: Payer: Self-pay

## 2022-09-21 DIAGNOSIS — Z79899 Other long term (current) drug therapy: Secondary | ICD-10-CM | POA: Diagnosis not present

## 2022-09-21 DIAGNOSIS — I10 Essential (primary) hypertension: Secondary | ICD-10-CM | POA: Insufficient documentation

## 2022-09-21 DIAGNOSIS — E876 Hypokalemia: Secondary | ICD-10-CM | POA: Diagnosis not present

## 2022-09-21 LAB — BASIC METABOLIC PANEL
Anion gap: 7 (ref 5–15)
BUN: 9 mg/dL (ref 6–20)
CO2: 28 mmol/L (ref 22–32)
Calcium: 9.5 mg/dL (ref 8.9–10.3)
Chloride: 103 mmol/L (ref 98–111)
Creatinine, Ser: 0.82 mg/dL (ref 0.44–1.00)
GFR, Estimated: >60 mL/min (ref >60)
Glucose, Bld: 118 mg/dL — ABNORMAL HIGH (ref 70–99)
Potassium: 3.3 mmol/L — ABNORMAL LOW (ref 3.5–5.1)
Sodium: 138 mmol/L (ref 135–145)

## 2022-09-21 MED ORDER — POTASSIUM CHLORIDE CRYS ER 20 MEQ PO TBCR
20.0000 meq | EXTENDED_RELEASE_TABLET | Freq: Once | ORAL | Status: AC
  Administered 2022-09-21: 20 meq via ORAL
  Filled 2022-09-21: qty 1

## 2022-09-21 MED ORDER — AMLODIPINE BESYLATE 5 MG PO TABS
5.0000 mg | ORAL_TABLET | Freq: Once | ORAL | Status: AC
  Administered 2022-09-21: 5 mg via ORAL
  Filled 2022-09-21: qty 1

## 2022-09-21 MED ORDER — AMLODIPINE BESYLATE 10 MG PO TABS: 10.0000 mg | ORAL_TABLET | Freq: Every day | ORAL | 0 refills | Status: AC

## 2022-09-21 NOTE — ED Provider Notes (Signed)
[facility] Provider Note   CSN: [id number] Arrival date & time: 09/21/22  [id number]     History  Chief Complaint  Patient presents with   Hypertension    Amanda Herrera is a 37 y.o. female.  Patient is a 37 year old female with a past medical history of hypertension, prediabetes and PCOS presenting to the emergency department with high blood pressure.  Patient states that she was previously on amlodipine and hydrochlorothiazide however about 2 years ago her primary doctor stopped her hydrochlorothiazide due to low potassium and she has only been on the amlodipine 5 mg.  She states that she does occasionally miss some doses.  She states that during her class last night they were practicing taking blood pressures on each other and her blood pressure measured to be in the A999333 systolic.  She states that she went home and it was still over [id number] systolic so she took an extra dose of her amlodipine, showered and then rested and her blood pressure improved to the 170s.  She states when she woke up this morning she checked her blood pressure and it was back in the 200s.  She states that she took her morning amlodipine then came to the emergency department.  She denies any recent headaches, chest pain, numbness or weakness, nausea or vomiting.  She states that she has been trying to stay with a low-sodium diet and drink plenty of water.  The history is provided by the patient.  Hypertension       Home Medications Prior to Admission medications   Medication Sig Start Date End Date Taking? Authorizing Provider  amLODipine (NORVASC) 10 MG tablet Take 1 tablet (10 mg total) by mouth daily. 09/21/22  Yes Maylon Peppers, Jordan Hawks K, DO  albuterol (VENTOLIN HFA) 108 (90 Base) MCG/ACT inhaler SMARTSIG:2 Puff(s) By Mouth Every 6 Hours 08/16/19   [provider]  cetirizine (ZYRTEC ALLERGY) 10 MG tablet Take 1 tablet (10 mg total) by mouth daily. 09/07/22   Volney American, PA-C  fluticasone [facility]) 50 MCG/ACT nasal spray Place 1 spray into both nostrils 2 (two) times daily. 09/07/22   Volney American, PA-C  metFORMIN (GLUCOPHAGE-XR) 500 MG 24 hr tablet Take 1 tablet (500 mg total) by mouth daily. 06/10/22   Shamleffer, Melanie Crazier, MD  norethindrone-ethinyl estradiol (LOESTRIN 1/20, 21,) 1-20 MG-MCG tablet Take 1 tablet by mouth daily. 06/01/22   Shamleffer, Melanie Crazier, MD  predniSONE (DELTASONE) 20 MG tablet Take 2 tablets (40 mg total) by mouth daily with breakfast. 09/07/22   Volney American, PA-C  Semaglutide,0.25 or 0.5MG/DOS, 2 MG/3ML SOPN Inject 0.5 mg into the skin once a week. 06/01/22   Shamleffer, Melanie Crazier, MD      Allergies    Augmentin [amoxicillin-pot clavulanate] and Penicillins    Review of Systems   Review of Systems  Physical Exam Updated Vital Signs BP (!) 176/113   Pulse 99   Temp 98.3 F (36.8 C) (Oral)   Resp 14   Ht 5' 3"$  (1.6 m)   LMP 09/07/2022 Comment: irregular  SpO2 100%   BMI 65.54 kg/m  Physical Exam Vitals and nursing note reviewed.  Constitutional:      General: She is not in acute distress.    Appearance: Normal appearance. She is obese.  HENT:     Head: Normocephalic and atraumatic.     Nose: Nose normal.     Mouth/Throat:     Mouth: Mucous membranes are  moist.     Pharynx: Oropharynx is clear.  Eyes:     Extraocular Movements: Extraocular movements intact.     Conjunctiva/sclera: Conjunctivae normal.     Pupils: Pupils are equal, round, and reactive to light.  Cardiovascular:     Rate and Rhythm: Normal rate and regular rhythm.     Heart sounds: Normal heart sounds.  Pulmonary:     Effort: Pulmonary effort is normal.     Breath sounds: Normal breath sounds.  Abdominal:     General: Abdomen is flat.     Palpations: Abdomen is soft.     Tenderness: There is no abdominal tenderness.  Musculoskeletal:        General: Normal range of motion.     Cervical  back: Normal range of motion and neck supple.  Skin:    General: Skin is warm and dry.  Neurological:     General: No focal deficit present.     Mental Status: She is alert and oriented to person, place, and time.     Cranial Nerves: No cranial nerve deficit.     Sensory: No sensory deficit.     Motor: No weakness.     Coordination: Coordination normal.  Psychiatric:        Mood and Affect: Mood normal.        Behavior: Behavior normal.     ED Results / Procedures / Treatments   Labs (all labs ordered are listed, but only abnormal results are displayed) Labs Reviewed  BASIC METABOLIC PANEL - Abnormal; Notable for the following components:      Result Value   Potassium 3.3 (*)    Glucose, Bld 118 (*)    All other components within normal limits  PREGNANCY, URINE    EKG None  Radiology No results found.  Procedures Procedures    Medications Ordered in ED Medications  potassium chloride SA (KLOR-CON M) CR tablet 20 mEq (has no administration in time range)  amLODipine (NORVASC) tablet 5 mg (5 mg Oral Given 09/21/22 0818)    ED Course/ Medical Decision Making/ A&P Clinical Course as of 09/21/22 0955  Wed Sep 21, 2022  0952 Since creatinine is normal.  Patient denies any chance of pregnancy see making preeclampsia unlikely.  Patient's blood pressures have remained in the 170s over 110s.  She is stable for discharge home with primary care follow-up for blood pressure recheck.  She was given strict return precautions. [VK]    Clinical Course User Index [VK] Kemper Durie, DO                             Medical Decision Making This patient presents to the ED with chief complaint(s) of hypertension with pertinent past medical history of hypertension, prediabetes, PCOS which further complicates the presenting complaint. The complaint involves an extensive differential diagnosis and also carries with it a high risk of complications and morbidity.    The  differential diagnosis includes asymptomatic hypertension, hypertensive urgency, hypertensive emergency, no signs or symptoms of CVA, ICH or mass effect from her high blood pressure, no signs or symptoms of ACS from her high blood pressure  Additional history obtained: Additional history obtained from N/A Records reviewed outpatient endocrine records  ED Course and Reassessment: Patient's initial blood pressure on arrival was 214/128 and repeat after resting in the room improved to 178/120.  The patient is asymptomatic without any focal neurologic deficits on exam.  Due  to her extremely high blood pressure, she will have labs performed to evaluate for kidney dysfunction from her blood pressure being elevated.  The patient will be given an extra dose of amlodipine she will be closely reassessed.  Independent labs interpretation:  The following labs were independently interpreted: Mild hypokalemia otherwise within normal range  Independent visualization of imaging: N/A  Consultation: - Consulted or discussed management/test interpretation w/ external professional: N/A  Consideration for admission or further workup: Patient has no emergent conditions requiring admission or further work-up at this time and is stable for discharge home with primary care follow-up  Social Determinants of health: N/A    Amount and/or Complexity of Data Reviewed Labs: ordered.  Risk Prescription drug management.          Final Clinical Impression(s) / ED Diagnoses Final diagnoses:  Asymptomatic hypertension    Rx / DC Orders ED Discharge Orders          Ordered    amLODipine (NORVASC) 10 MG tablet  Daily        09/21/22 [place], [place], DO 09/21/22 [phone]

## 2022-09-21 NOTE — Discharge Instructions (Addendum)
You were seen in the emergency department for your high blood pressure.  You had no signs of stroke or kidney dysfunction from her blood pressure being high.  I have increased your amlodipine to 10 mg daily and you should take this daily as prescribed.  You can also check your blood pressure each day and keep a log.  I recommend checking it when you have been resting for at least 15 minutes and at the same time every day.  You should follow-up with your primary doctor within the next week to have your blood pressure rechecked and to see if you need any further changes to your medications.  You should return to the emergency department if you have severe headaches, severe chest pain, numbness or weakness on one side of the body compared to the other or if you have any other new or concerning symptoms.

## 2022-09-21 NOTE — ED Triage Notes (Signed)
Pt from home stated that she had measured her BP the last few days and that her BP was high. Pt is currently on 26m of amlodipine but feels she needs to have BP med's adjusted.  Pt denis headache, Pt denis chest pain and is asymptomatic.

## 2022-10-10 ENCOUNTER — Ambulatory Visit: Payer: BLUE CROSS/BLUE SHIELD | Admitting: Internal Medicine

## 2022-10-10 NOTE — Progress Notes (Deleted)
Name: Amanda Herrera  MRN/ DOB: [id number], 14-Oct-1985    Age/ Sex: 37 y.o., female    PCP: Sharilyn Sites, MD   Reason for Endocrinology Evaluation: PCOS     Date of Initial Endocrinology Evaluation: 05/27/2022     HPI: Amanda Herrera is a 37 y.o. female with a past medical history of PCOS , Hx of prediabetes . The patient presented for initial endocrinology clinic visit on 10/10/2022 for consultative assistance with her PCOS.    The patient indicates that she was first diagnosed with PCOS in at age 24   Menarch 35 , historically she has had irregular menstruation, she was seen by GYN ~2021 and was noted with thickened endometrium.  She has chronic hirsutism , stable in nature over the face, thigh, back, nipples, back and chest    Has skin tags and acanthosis nigricans    Now sees Dr . Garwin Brothers , up  to date on pap smear  Was on OCP's in 2022 but due to heavy bleeding it was stopped 06/2021 follows by normal menstruations until March when it stopped again  The patient has experienced metabolic issues including obesity ,  insulin resistance  She has mood swings , was seeing a therapist, no dx of eating disorder but she has depression   She was put on Metformin and levothyroxine through [facility] clinic until she lost her insurance   Metformin made her sick , has been taking berberine    She denies clinical features of Cushing's such as purple striae, but has central obesity. She has OSA on CPAP  dx summer in 2022   She denies  have family history of PCOS, thyroid disease  , ovarian cancer.     On her initial visit to our clinic she had an A1c of 6.2%, we started metformin as her insurance declined to cover for any GLP-1 agonists  Testosterone was slightly elevated at 51 NG/DL 05/2022, with normal prolactin5.5 NG/mL, normal DHEA-S 79 mcg/DL,17 OH progesterone 45 NG/mL, and a normal 24-hour urinary cortisol at 28.8 mcg  SUBJECTIVE:    Today (10/10/22):  Amanda  Herrera is here for follow-up on PCOS, hirsutism, and prediabetes .  She was started on Loestrin but unfortunately developed menorrhagia requiring ED visit.  She was put on Lysteda 1.3 mg 3 times daily for 5 days 06/2022  She was also evaluated in the ED on 09/21/2022 with uncontrolled hypertension, BP 176/113 mmHg, increased amlodipine  Metformin 500 mg XR daily   HISTORY:  Past Medical History:  Past Medical History:  Diagnosis Date   Asthma    Hypertension    PCOS (polycystic ovarian syndrome)    Past Surgical History:  Past Surgical History:  Procedure Laterality Date   HIP SURGERY      Social History:  reports that she has never smoked. She has never used smokeless tobacco. She reports that she does not use drugs. Family History: family history includes Diabetes in her father; Hypertension in her father and mother.   HOME MEDICATIONS: Allergies as of 10/10/2022       Reactions   Augmentin [amoxicillin-pot Clavulanate]    Penicillins         Medication List        Accurate as of October 10, 2022  7:07 AM. If you have any questions, ask your nurse or doctor.          albuterol 108 (90 Base) MCG/ACT inhaler Commonly known as: VENTOLIN HFA SMARTSIG:2  Puff(s) By Mouth Every 6 Hours   amLODipine 10 MG tablet Commonly known as: NORVASC Take 1 tablet (10 mg total) by mouth daily.   cetirizine 10 MG tablet Commonly known as: ZyrTEC Allergy Take 1 tablet (10 mg total) by mouth daily.   fluticasone 50 MCG/ACT nasal spray Commonly known as: FLONASE Place 1 spray into both nostrils 2 (two) times daily.   metFORMIN 500 MG 24 hr tablet Commonly known as: GLUCOPHAGE-XR Take 1 tablet (500 mg total) by mouth daily.   norethindrone-ethinyl estradiol 1-20 MG-MCG tablet Commonly known as: Loestrin 1/20 (21) Take 1 tablet by mouth daily.   predniSONE 20 MG tablet Commonly known as: DELTASONE Take 2 tablets (40 mg total) by mouth daily with breakfast.   Semaglutide(0.25  or 0.'5MG'$ /DOS) 2 MG/3ML Sopn Inject 0.5 mg into the skin once a week.          REVIEW OF SYSTEMS: A comprehensive ROS was conducted with the patient and is negative except as per HPI     OBJECTIVE:  VS: There were no vitals taken for this visit.   Wt Readings from Last 3 Encounters:  05/27/22 (!) 370 lb (167.8 kg)  02/04/20 (!) 361 lb (163.7 kg)  01/07/20 (!) 367 lb (166.5 kg)     EXAM: General: Pt appears well and is in NAD  Eyes: External eye exam normal without stare, lid lag or exophthalmos.  EOM intact.   Neck: General: Supple without adenopathy. Thyroid: Thyroid size normal.  No goiter or nodules appreciated.  Skin tags and acanthosis nigricans noted  Lungs: Clear with good BS bilat with no rales, rhonchi, or wheezes  Heart: Auscultation: RRR.  Abdomen: Normoactive bowel sounds, soft, nontender, without masses or organomegaly palpable  Extremities:  BL LE: No pretibial edema normal  Skin: Ferriman-Gallwey score 36  Mental Status: Judgment, insight: Intact Orientation: Oriented to time, place, and person Mood and affect: No depression, anxiety, or agitation     DATA REVIEWED:  Latest Reference Range & Units 05/27/22 15:36  Sodium 135 - 146 mmol/L 138  Potassium 3.5 - 5.3 mmol/L 3.7  Chloride 98 - 110 mmol/L 102  CO2 20 - 32 mmol/L 24  Glucose 65 - 99 mg/dL 74  Mean Plasma Glucose mg/dL 131  BUN 7 - 25 mg/dL 10  Creatinine 0.50 - 0.97 mg/dL 0.77  Calcium 8.6 - 10.2 mg/dL 9.4  BUN/Creatinine Ratio 6 - 22 (calc) SEE NOTE:  AG Ratio 1.0 - 2.5 (calc) 1.3  AST 10 - 30 U/L 13  ALT 6 - 29 U/L 12  Total Protein 6.1 - 8.1 g/dL 7.6  Total Bilirubin 0.2 - 1.2 mg/dL 0.5  Total CHOL/HDL Ratio <5.0 (calc) 4.6  Cholesterol <200 mg/dL 189  HDL Cholesterol > OR = 50 mg/dL 41 (L)  LDL Cholesterol (Calc) mg/dL (calc) 123 (H)  Non-HDL Cholesterol (Calc) <130 mg/dL (calc) 148 (H)  Triglycerides <150 mg/dL 142  Alkaline phosphatase (APISO) 31 - 125 U/L 62  Globulin 1.9 -  3.7 g/dL (calc) 3.3  DHEA-SO4 19 - 237 mcg/dL 79  Cortisol, Plasma mcg/dL 11.0  LH mIU/mL 7.7  FSH mIU/mL 5.9  Prolactin ng/mL 5.5  eAG (mmol/L) mmol/L 7.3  Hemoglobin A1C <5.7 % of total Hgb 6.2 (H)      ASSESSMENT/PLAN/RECOMMENDATIONS:   PCOS:  -The management of PCOS involves amelioration of hyperandrogenic symptoms, metabolic abnormalities , prevention of endometrial hyperplasia and carcinoma as well as contraception for those not pursuing pregnancy.  -We will proceed with 17 hydroxyprogesterone,  ACTH, cortisol, 24-hour urinary cortisol, TFTs  2.  Hirsutism:  -This is severe -We will check DHEA-S, testosterone -We discussed that COC's will reduce testosterone levels and may help with hirsutism -We may consider spironolactone as a secondary agent   3.  Secondary amenorrhea:   -She was on OCPs but developed heavy bleeding until she stopped in November 2022 -She is under the care of Dr. Garwin Brothers -We will proceed with prolactin, estradiol, FSH and LH   4.  Prediabetes:  -A1c today 6.2 %  -She has been diagnosed with prediabetes in the past but her A1c has normalized -She is intolerant to metformin that was given to her through [facility] -She does not recall being on XR formulation of metformin -We discussed starting Mounjaro, cautioned against GI side effects, we also discussed that if this is not covered we may switch to Ozempic   Medication  Start Mounjaro 2.5 mg weekly    5.  Morbid obesity:  -She is an emotional eater -She has not been exercising, she does snack during the day, she is does not drink sugar sweetened beverages -We discussed being on specific and a consistent diet such as Noom, weight watchers -I have encouraged brisk walking 20-30 minutes a day -She has not been on weight loss medications in the past    Follow-up in 4 months     Signed electronically by: Mack Guise, MD  [facility]  [place]  [facility] [address] [place], [place] [address] Phone: [phone] FAX: [phone]   CC: Sharilyn Sites, [place] [id number] Phone: [phone] Fax: [phone]   Return to Endocrinology clinic as below: Future Appointments  Date Time Provider [place]  10/10/2022  8:10 AM Nitara Szczerba, Melanie Crazier, MD [id number] None

## 2022-12-12 ENCOUNTER — Telehealth: Payer: Self-pay

## 2022-12-12 NOTE — Telephone Encounter (Signed)
[employer] needs call back regarding the Ozempic authorization. [phone]

## 2022-12-13 ENCOUNTER — Other Ambulatory Visit (HOSPITAL_COMMUNITY): Payer: Self-pay

## 2022-12-13 NOTE — Telephone Encounter (Signed)
Did they leave a case number?   I tried giving them a call back but I did not have a case number to give, so they couldn't help me. The last Ozempic PA I can find was done in October of last year and was denied due to the patient not having a diagnosis of type 2 diabetes.

## 2022-12-13 NOTE — Telephone Encounter (Signed)
No they just said to press option 1

## 2023-01-06 DIAGNOSIS — K047 Periapical abscess without sinus: Secondary | ICD-10-CM | POA: Diagnosis not present

## 2023-02-22 DIAGNOSIS — U071 COVID-19: Secondary | ICD-10-CM | POA: Diagnosis not present

## 2023-07-11 DIAGNOSIS — M25562 Pain in left knee: Secondary | ICD-10-CM | POA: Diagnosis not present

## 2023-07-11 DIAGNOSIS — F980 Enuresis not due to a substance or known physiological condition: Secondary | ICD-10-CM | POA: Diagnosis not present

## 2023-07-11 DIAGNOSIS — Z6841 Body Mass Index (BMI) 40.0 and over, adult: Secondary | ICD-10-CM | POA: Diagnosis not present

## 2023-07-14 DIAGNOSIS — L0292 Furuncle, unspecified: Secondary | ICD-10-CM | POA: Diagnosis not present

## 2023-08-05 ENCOUNTER — Ambulatory Visit
Admission: EM | Admit: 2023-08-05 | Discharge: 2023-08-05 | Disposition: A | Discharge status: Home / Self Care | Payer: Medicaid Other | Attending: Family Medicine | Admitting: Family Medicine

## 2023-08-05 ENCOUNTER — Ambulatory Visit (INDEPENDENT_AMBULATORY_CARE_PROVIDER_SITE_OTHER): Payer: BLUE CROSS/BLUE SHIELD

## 2023-08-05 DIAGNOSIS — J209 Acute bronchitis, unspecified: Secondary | ICD-10-CM | POA: Diagnosis not present

## 2023-08-05 DIAGNOSIS — J3089 Other allergic rhinitis: Secondary | ICD-10-CM | POA: Diagnosis not present

## 2023-08-05 DIAGNOSIS — R062 Wheezing: Secondary | ICD-10-CM | POA: Diagnosis not present

## 2023-08-05 DIAGNOSIS — R0789 Other chest pain: Secondary | ICD-10-CM | POA: Diagnosis not present

## 2023-08-05 DIAGNOSIS — R051 Acute cough: Secondary | ICD-10-CM

## 2023-08-05 DIAGNOSIS — M25562 Pain in left knee: Secondary | ICD-10-CM | POA: Diagnosis not present

## 2023-08-05 DIAGNOSIS — J4521 Mild intermittent asthma with (acute) exacerbation: Secondary | ICD-10-CM

## 2023-08-05 MED ORDER — PROMETHAZINE-DM 6.25-15 MG/5ML PO SYRP: 5.0000 mL | ORAL_SOLUTION | Freq: Four times a day (QID) | ORAL | 0 refills | Status: DC | PRN

## 2023-08-05 MED ORDER — CETIRIZINE HCL 10 MG PO TABS: 10.0000 mg | ORAL_TABLET | Freq: Every day | ORAL | 2 refills | Status: DC

## 2023-08-05 MED ORDER — ALBUTEROL SULFATE HFA 108 (90 BASE) MCG/ACT IN AERS: 2.0000 | INHALATION_SPRAY | RESPIRATORY_TRACT | 0 refills | Status: AC | PRN

## 2023-08-05 MED ORDER — ALBUTEROL SULFATE (2.5 MG/3ML) 0.083% IN NEBU
2.5000 mg | INHALATION_SOLUTION | Freq: Once | RESPIRATORY_TRACT | Status: AC
  Administered 2023-08-05: 2.5 mg via RESPIRATORY_TRACT

## 2023-08-05 MED ORDER — PREDNISONE 20 MG PO TABS: 40.0000 mg | ORAL_TABLET | Freq: Every day | ORAL | 0 refills | Status: DC

## 2023-08-05 MED ORDER — FLUTICASONE PROPIONATE 50 MCG/ACT NA SUSP: 1.0000 | Freq: Two times a day (BID) | NASAL | 2 refills | Status: AC

## 2023-08-05 NOTE — Discharge Instructions (Signed)
I have refilled your allergy medications for daily use as well as sent in prednisone, albuterol, cough syrup to treat your current symptoms.  The prednisone should also help with your knee pain but you may also do ice, elevation, knee sleeves to help additionally.

## 2023-08-05 NOTE — ED Triage Notes (Signed)
Pt reports she has nasal congestion, throat drainage, cough and left knee pain x 2 weeks

## 2023-08-05 NOTE — ED Provider Notes (Signed)
[facility]    CSN: [id number] Arrival date & time: 08/05/23  1027      History   Chief Complaint No chief complaint on file.   HPI Amanda Herrera is a 37 y.o. female.   Patient presenting today with 2-week history of nasal congestion, postnasal drainage, cough, wheezing, chest tightness.  Denies fever, chills, chest pain, abdominal pain, nausea vomiting or diarrhea.  Has been taking Zyrtec and Mucinex with minimal relief.  History of asthma on albuterol as needed.  She is also having left knee pain for the past 2 weeks.  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  Pain is worse when bending and moving.  Denies swelling, discoloration, numbness, tingling, loss of range of motion.    Past Medical History:  Diagnosis Date   Asthma    Hypertension    PCOS (polycystic ovarian syndrome)     There are no active problems to display for this patient.   Past Surgical History:  Procedure Laterality Date   HIP SURGERY      OB History   No obstetric history on file.      Home Medications    Prior to Admission medications   Medication Sig Start Date End Date Taking? Authorizing Provider  promethazine-dextromethorphan (PROMETHAZINE-DM) 6.25-15 MG/5ML syrup Take 5 mLs by mouth 4 (four) times daily as needed. 08/05/23  Yes Particia Nearing, PA-C  albuterol (VENTOLIN HFA) 108 (90 Base) MCG/ACT inhaler Inhale 2 puffs into the lungs every 4 (four) hours as needed for wheezing or shortness of breath. 08/05/23   Particia Nearing, PA-C  amLODipine (NORVASC) 10 MG tablet Take 1 tablet (10 mg total) by mouth daily. 09/21/22   Rexford Maus, DO  cetirizine (ZYRTEC ALLERGY) 10 MG tablet Take 1 tablet (10 mg total) by mouth daily. 08/05/23   Particia Nearing, PA-C  fluticasone [facility]) 50 MCG/ACT nasal spray Place 1 spray into both nostrils 2 (two) times daily. 08/05/23   Particia Nearing, PA-C   metFORMIN (GLUCOPHAGE-XR) 500 MG 24 hr tablet Take 1 tablet (500 mg total) by mouth daily. 06/10/22   Shamleffer, Konrad Dolores, MD  norethindrone-ethinyl estradiol (LOESTRIN 1/20, 21,) 1-20 MG-MCG tablet Take 1 tablet by mouth daily. 06/01/22   Shamleffer, Konrad Dolores, MD  predniSONE (DELTASONE) 20 MG tablet Take 2 tablets (40 mg total) by mouth daily with breakfast. 08/05/23   Particia Nearing, PA-C    Family History Family History  Problem Relation Age of Onset   Hypertension Mother    Hypertension Father    Diabetes Father     Social History Social History   Tobacco Use   Smoking status: Never   Smokeless tobacco: Never  Substance Use Topics   Drug use: Never     Allergies   Augmentin [amoxicillin-pot clavulanate] and Penicillins   Review of Systems Review of Systems Per HPI  Physical Exam Triage Vital Signs ED Triage Vitals  Encounter Vitals Group     BP 08/05/23 1036 (!) 189/99     Systolic BP Percentile --      Diastolic BP Percentile --      Pulse Rate 08/05/23 1036 92     Resp 08/05/23 1036 14     Temp 08/05/23 1036 98.2 F (36.8 C)     Temp Source 08/05/23 1036 Oral     SpO2 08/05/23 1036 99 %     Weight --  Height --      Head Circumference --      Peak Flow --      Pain Score 08/05/23 1038 4     Pain Loc --      Pain Education --      Exclude from Growth Chart --    No data found.  Updated Vital Signs BP (!) 189/99 (BP Location: Right Wrist)   Pulse 92   Temp 98.2 F (36.8 C) (Oral)   Resp 14   LMP 08/03/2023   SpO2 99%   Visual Acuity Right Eye Distance:   Left Eye Distance:   Bilateral Distance:    Right Eye Near:   Left Eye Near:    Bilateral Near:     Physical Exam Vitals and nursing note reviewed.  Constitutional:      Appearance: Normal appearance. She is not ill-appearing.  HENT:     Head: Atraumatic.     Right Ear: Tympanic membrane and external ear normal.     Left Ear: Tympanic membrane and  external ear normal.     Nose: Rhinorrhea present.     Mouth/Throat:     Mouth: Mucous membranes are moist.     Pharynx: Posterior oropharyngeal erythema present.  Eyes:     Extraocular Movements: Extraocular movements intact.     Conjunctiva/sclera: Conjunctivae normal.  Cardiovascular:     Rate and Rhythm: Normal rate and regular rhythm.     Heart sounds: Normal heart sounds.  Pulmonary:     Effort: Pulmonary effort is normal.     Breath sounds: Wheezing present. No rales.  Musculoskeletal:        General: No swelling, tenderness, deformity or signs of injury. Normal range of motion.     Cervical back: Normal range of motion and neck supple.  Skin:    General: Skin is warm and dry.     Findings: No erythema.  Neurological:     Mental Status: She is alert and oriented to person, place, and time.     Motor: No weakness.     Gait: Gait normal.     Comments: Bilateral lower extremities neurovascular intact  Psychiatric:        Mood and Affect: Mood normal.        Thought Content: Thought content normal.        Judgment: Judgment normal.      UC Treatments / Results  Labs (all labs ordered are listed, but only abnormal results are displayed) Labs Reviewed - No data to display  EKG   Radiology DG Chest 2 View Result Date: 08/05/2023 CLINICAL DATA:  Wheezing.  Chest tightness. EXAM: CHEST - 2 VIEW COMPARISON:  Chest radiograph 03/13/2021. FINDINGS: Cardiac contours upper limits of normal. No large area pulmonary consolidation. No pleural effusion or pneumothorax. Thoracic spine degenerative changes. IMPRESSION: No active cardiopulmonary disease. Electronically Signed   By: Annia Belt M.D.   On: 08/05/2023 13:15    Procedures Procedures (including critical care time)  Medications Ordered in UC Medications  albuterol (PROVENTIL) (2.5 MG/3ML) 0.083% nebulizer solution 2.5 mg (2.5 mg Nebulization Given 08/05/23 1219)    Initial Impression / Assessment and Plan / UC  Course  I have reviewed the triage vital signs and the nursing notes.  Pertinent labs & imaging results that were available during my care of the patient were reviewed by me and considered in my medical decision making (see chart for details).     Hypertensive in triage, otherwise vital  signs reassuring.  Suspect seasonal allergy and asthma exacerbation causing symptoms.  Good improvement after albuterol nebulizer treatment in clinic.  Chest x-ray negative for pneumonia.  Will refill Zyrtec and Flonase for daily allergy regimen, refill albuterol and treat with prednisone, Phenergan DM additionally.  Discussed that the prednisone would likely improve the knee pain which ice pack to be arthritic/inflammatory in nature.  Discussed RICE protocol, knee sleeves additionally.  Return for worsening symptoms.  Work note given.  Final Clinical Impressions(s) / UC Diagnoses   Final diagnoses:  Acute cough  Acute bronchitis, unspecified organism  Seasonal allergic rhinitis due to other allergic trigger  Acute pain of left knee  Mild intermittent asthma with acute exacerbation     Discharge Instructions      I have refilled your allergy medications for daily use as well as sent in prednisone, albuterol, cough syrup to treat your current symptoms.  The prednisone should also help with your knee pain but you may also do ice, elevation, knee sleeves to help additionally.    ED Prescriptions     Medication Sig Dispense Auth. Provider   predniSONE (DELTASONE) 20 MG tablet Take 2 tablets (40 mg total) by mouth daily with breakfast. 10 tablet Particia Nearing, PA-C   albuterol (VENTOLIN HFA) 108 (90 Base) MCG/ACT inhaler Inhale 2 puffs into the lungs every 4 (four) hours as needed for wheezing or shortness of breath. 18 g Particia Nearing, PA-C   fluticasone [facility]) 50 MCG/ACT nasal spray Place 1 spray into both nostrils 2 (two) times daily. 16 g Particia Nearing, [place]   cetirizine  (ZYRTEC ALLERGY) 10 MG tablet Take 1 tablet (10 mg total) by mouth daily. 30 tablet Particia Nearing, [place]   promethazine-dextromethorphan (PROMETHAZINE-DM) 6.25-15 MG/5ML syrup Take 5 mLs by mouth 4 (four) times daily as needed. 100 mL Particia Nearing, [place]      PDMP not reviewed this encounter.   Particia Nearing, [place] 08/05/23 1426

## 2023-09-04 DIAGNOSIS — M25562 Pain in left knee: Secondary | ICD-10-CM | POA: Diagnosis not present

## 2024-05-10 DIAGNOSIS — R030 Elevated blood-pressure reading, without diagnosis of hypertension: Secondary | ICD-10-CM | POA: Diagnosis not present

## 2024-05-10 DIAGNOSIS — K0381 Cracked tooth: Secondary | ICD-10-CM | POA: Diagnosis not present

## 2024-05-10 DIAGNOSIS — K122 Cellulitis and abscess of mouth: Secondary | ICD-10-CM | POA: Diagnosis not present

## 2024-06-22 ENCOUNTER — Emergency Department (HOSPITAL_BASED_OUTPATIENT_CLINIC_OR_DEPARTMENT_OTHER)
Admission: EM | Admit: 2024-06-22 | Discharge: 2024-06-22 | Disposition: A | Discharge status: Home / Self Care | Attending: Emergency Medicine | Admitting: Emergency Medicine

## 2024-06-22 ENCOUNTER — Other Ambulatory Visit: Payer: Self-pay

## 2024-06-22 ENCOUNTER — Encounter (HOSPITAL_BASED_OUTPATIENT_CLINIC_OR_DEPARTMENT_OTHER): Payer: Self-pay | Admitting: Emergency Medicine

## 2024-06-22 DIAGNOSIS — I10 Essential (primary) hypertension: Secondary | ICD-10-CM | POA: Insufficient documentation

## 2024-06-22 DIAGNOSIS — K029 Dental caries, unspecified: Secondary | ICD-10-CM | POA: Insufficient documentation

## 2024-06-22 DIAGNOSIS — R519 Headache, unspecified: Secondary | ICD-10-CM | POA: Diagnosis present

## 2024-06-22 DIAGNOSIS — E119 Type 2 diabetes mellitus without complications: Secondary | ICD-10-CM | POA: Diagnosis not present

## 2024-06-22 DIAGNOSIS — Z79899 Other long term (current) drug therapy: Secondary | ICD-10-CM | POA: Insufficient documentation

## 2024-06-22 DIAGNOSIS — Z7984 Long term (current) use of oral hypoglycemic drugs: Secondary | ICD-10-CM | POA: Diagnosis not present

## 2024-06-22 MED ORDER — CYCLOBENZAPRINE HCL 10 MG PO TABS
10.0000 mg | ORAL_TABLET | Freq: Once | ORAL | Status: AC
  Administered 2024-06-22: 10 mg via ORAL
  Filled 2024-06-22: qty 1

## 2024-06-22 MED ORDER — CYCLOBENZAPRINE HCL 10 MG PO TABS: 10.0000 mg | ORAL_TABLET | Freq: Two times a day (BID) | ORAL | 0 refills | Status: AC | PRN

## 2024-06-22 NOTE — ED Provider Notes (Signed)
 [place] EMERGENCY DEPARTMENT AT [facility] Provider Note   CSN: [id number] Arrival date & time: 06/22/24  1731     History Chief Complaint  Patient presents with   Hypertension    HPI: Amanda Herrera is a 38 y.o. female with history pertinent for hypertension on amlodipine , T2DM, elevated BMI, PCOS who presents complaining of head pressure/sinus pressure. Patient arrived via POV accompanied by friend, [name].  History provided by patient.  No interpreter required during this encounter.  Patient reports that she was recently diagnosed with a tooth infection, is on day 4 of clindamycin, and feels that her tooth is doing better.  Reports that she has a dental appointment in 6 days to have a root canal.  Reports that earlier today she had a sensation of head pressure/sinus pressure/fullness, and that she had tension that radiated to her shoulders.  Reports that she used a shoulder massager which relieved her symptoms, however she noted that her blood pressure was elevated, and felt that this might be the cause of her symptoms.  Reports that she had recently missed 3 doses of her amlodipine  approximately 1 week ago.  Denies any headache, vision changes, chest pain, shortness of breath, fevers, chills, nausea, vomiting, diarrhea.  Patient's recorded medical, surgical, social, medication list and allergies were reviewed in the Snapshot window as part of the initial history.   Prior to Admission medications   Medication Sig Start Date End Date Taking? Authorizing Provider  albuterol  (VENTOLIN  HFA) 108 (90 Base) MCG/ACT inhaler Inhale 2 puffs into the lungs every 4 (four) hours as needed for wheezing or shortness of breath. 08/05/23   Stuart Vernell Norris, PA-C  amLODipine  (NORVASC ) 10 MG tablet Take 1 tablet (10 mg total) by mouth daily. 09/21/22   Kingsley, Victoria K, DO  cetirizine  (ZYRTEC  ALLERGY) 10 MG tablet Take 1 tablet (10 mg total) by mouth daily. 08/05/23   Stuart Vernell Norris, PA-C  cyclobenzaprine (FLEXERIL) 10 MG tablet Take 1 tablet (10 mg total) by mouth 2 (two) times daily as needed for muscle spasms. 06/22/24  Yes Rogelia Jerilynn RAMAN, MD  fluticasone  (FLONASE ) 50 MCG/ACT nasal spray Place 1 spray into both nostrils 2 (two) times daily. 08/05/23   Stuart Vernell Norris, PA-C  metFORMIN  (GLUCOPHAGE -XR) 500 MG 24 hr tablet Take 1 tablet (500 mg total) by mouth daily. 06/10/22   Shamleffer, Ibtehal Jaralla, MD  norethindrone -ethinyl estradiol  (LOESTRIN 1/20, 21,) 1-20 MG-MCG tablet Take 1 tablet by mouth daily. 06/01/22   Shamleffer, Ibtehal Jaralla, MD  predniSONE  (DELTASONE ) 20 MG tablet Take 2 tablets (40 mg total) by mouth daily with breakfast. 08/05/23   Stuart Vernell Norris, PA-C  promethazine -dextromethorphan (PROMETHAZINE -DM) 6.25-15 MG/5ML syrup Take 5 mLs by mouth 4 (four) times daily as needed. 08/05/23   Stuart Vernell Norris, PA-C     Allergies: Augmentin [amoxicillin-pot clavulanate] and Penicillins   Review of Systems   ROS as per HPI  Physical Exam Updated Vital Signs BP (!) 155/100   Pulse 83   Temp 98.1 F (36.7 C)   Resp 18   LMP 05/23/2024 (Approximate)   SpO2 95%  Physical Exam Vitals and nursing note reviewed.  Constitutional:      General: She is not in acute distress.    Appearance: She is well-developed.  HENT:     Head: Normocephalic and atraumatic.     Mouth/Throat:     Comments: Multiple dental caries, no focal fluctuance, erythema, swelling Eyes:     Conjunctiva/sclera: Conjunctivae normal.  Cardiovascular:     Rate and Rhythm: Normal rate and regular rhythm.     Pulses:          Radial pulses are 2+ on the right side and 2+ on the left side.     Heart sounds: No murmur heard. Pulmonary:     Effort: Pulmonary effort is normal. No respiratory distress.     Breath sounds: Normal breath sounds.  Abdominal:     Palpations: Abdomen is soft.     Tenderness: There is no abdominal tenderness.   Musculoskeletal:        General: No swelling.     Cervical back: Neck supple.  Skin:    General: Skin is warm and dry.     Capillary Refill: Capillary refill takes less than 2 seconds.  Neurological:     Mental Status: She is alert.  Psychiatric:        Mood and Affect: Mood normal.     ED Course/ Medical Decision Making/ A&P    Procedures Procedures   Medications Ordered in ED Medications  cyclobenzaprine (FLEXERIL) tablet 10 mg (has no administration in time range)    Medical Decision Making:   Amanda Herrera is a 38 y.o. female who presents for hypertension and sensation of head fullness as per above.  Physical exam is pertinent for no focal abnormalities, dental caries without focal erythema, fluctuance, swelling  The differential includes but is not limited to dental infection, sepsis, hypertension, hypertensive emergency.  Independent historian: None  External data reviewed: No pertinent external data  Labs: Not indicated  Radiology: Not indicated No results found.  EKG/Medicine tests: Not indicated EKG Interpretation:                  Interventions: Flexeril  See the EMR for full details regarding lab and imaging results.  Patient presents for sensation of pressure and tension in her head and neck.  Reports that she is on clindamycin for a dental infection.  Patient does not have evidence of infection on exam, therefore feel that clindamycin is appropriate.  Patient vitally stable, afebrile, nontachycardic, therefore doubt sepsis, do not feel that patient requires labs for sepsis.  With regard to patient's hypertension, no symptoms concerning for hypertensive emergencies, no chest pain to suggest aortic dissection, ACS, no shortness of breath to suggest pulmonary edema, no vision changes to suggest retinal hemorrhages, no altered mental status, headache, focal neurologic deficits to suggest PRES, ICH.  Patient denies any urinary changes concerning for renal  dysfunction.  Discussed hypertensive emergencies versus hypertension with patient, discussed follow-up with PCP for reevaluation and possible titration of him home amlodipine , as well as strict return precautions.  Given patient did report that symptoms improved with muscle massage, possible that patient had a muscle tension component of her symptoms earlier today, inquires if there is any medication to help with this, recommended continuation of massage that patient is doing at home given this was alleviating, and feel that short course of Flexeril is overall low risk, they will provide dose of Flexeril as well as short course of outpatient Flexeril.  Presentation is most consistent with acute uncomplicated illness and I did consider and rule out acute life/limb-threatening illness  Discussion of management or test interpretations with external provider(s): Not indicated  Risk Drugs:Prescription drug management  Disposition: DISCHARGE: I believe that the patient is safe for discharge home with outpatient follow-up. Patient was informed of all pertinent physical exam, laboratory, and imaging findings.  Patient's suspected  etiology of their symptom presentation was discussed with the patient and all questions were answered. We discussed following up with PCP, dentistry. I provided thorough ED return precautions. The patient feels safe and comfortable with this plan.  MDM generated using voice dictation software and may contain dictation errors.  Please contact me for any clarification or with any questions.  Clinical Impression:  1. Pressure in head   2. Hypertension, unspecified type      Data Unavailable   Final Clinical Impression(s) / ED Diagnoses Final diagnoses:  Pressure in head  Hypertension, unspecified type    Rx / DC Orders ED Discharge Orders          Ordered    cyclobenzaprine (FLEXERIL) 10 MG tablet  2 times daily PRN        06/22/24 TRENNA Rogelia Jerilynn GORMAN, MD 06/22/24 1824

## 2024-06-22 NOTE — ED Notes (Signed)
 Pt alert and oriented X 4 at the time of discharge. RR even and unlabored. No acute distress noted. Pt verbalized understanding of discharge instructions as discussed. Pt ambulatory to lobby at time of discharge.

## 2024-06-22 NOTE — ED Triage Notes (Signed)
 High BP at home  My head felt woozy earlier Reported head fullness, sinus pressure, now feels fine Took amlodipine  today  Denies sob, no chest pain  Reports currently being treated for tooth infection on abt

## 2024-06-22 NOTE — Discharge Instructions (Signed)
 Amanda Herrera  Thank you for allowing us  to take care of you today.  You came to the Emergency Department today because you had elevated blood pressure readings today.  You do not have any fever, low blood pressure, elevated heart rate which would suggest a severe infection.  We recommend continuing to follow-up with your dentist for management of your tooth pain.  Your blood pressure was elevated today, and you had some tension in your head, face, neck that improved with massage.  Given this may have a muscular component, we are prescribing you a muscle relaxer called Flexeril that you can use up to twice a day as needed for muscle discomfort.  Your symptoms are not concerning for a emergency related to your blood pressure, hypertensive emergencies would be bleeding in the head (evidenced by confusion, the worst headache of your life, difficulty moving a feeling part of your body that usually will move or feel) bleeding in your eyes (evidenced by new visual changes), tear in the big blood vessel in your chest or heart attack (evidenced by heart attack), your lungs feeling with fluid (evidenced by shortness of breath and low oxygen) or kidney injury (which can come with change in urine output.  Given you are not having any of the symptoms, your presentation is not consistent with an emergency related to your high blood pressure, however you should continue to follow-up with your primary care doctor to see if you need further titration of your home amlodipine .  To-Do: 1. Please follow-up with your primary doctor within 1 - 2 weeks / as soon as possible.   Please return to the Emergency Department or call 911 if you experience have worsening of your symptoms, or do not get better, chest pain, shortness of breath, severe or significantly worsening pain, high fever, severe confusion, pass out or have any reason to think that you need emergency medical care.   We hope you feel better soon.   Mitzie Later,  MD Department of Emergency Medicine [facility]

## 2024-06-26 ENCOUNTER — Encounter: Payer: Self-pay | Admitting: Emergency Medicine

## 2024-06-26 ENCOUNTER — Telehealth: Payer: Self-pay

## 2024-06-26 ENCOUNTER — Ambulatory Visit
Admission: EM | Admit: 2024-06-26 | Discharge: 2024-06-26 | Disposition: A | Discharge status: Home / Self Care | Attending: Family Medicine | Admitting: Family Medicine

## 2024-06-26 ENCOUNTER — Telehealth: Payer: Self-pay | Admitting: Emergency Medicine

## 2024-06-26 DIAGNOSIS — R030 Elevated blood-pressure reading, without diagnosis of hypertension: Secondary | ICD-10-CM

## 2024-06-26 DIAGNOSIS — J309 Allergic rhinitis, unspecified: Secondary | ICD-10-CM | POA: Diagnosis not present

## 2024-06-26 MED ORDER — CETIRIZINE HCL 10 MG PO TABS: 10.0000 mg | ORAL_TABLET | Freq: Every day | ORAL | 2 refills | Status: AC

## 2024-06-26 MED ORDER — AZELASTINE HCL 0.1 % NA SOLN: 1.0000 | Freq: Two times a day (BID) | NASAL | 2 refills | Status: AC

## 2024-06-26 MED ORDER — AZELASTINE HCL 0.1 % NA SOLN: 1.0000 | Freq: Two times a day (BID) | NASAL | 2 refills | Status: DC

## 2024-06-26 NOTE — Telephone Encounter (Signed)
 Pt has requested that her medications be sent to [facility] [address] instead of [facility] on [address]. Change has been made per pt's request.

## 2024-06-26 NOTE — ED Provider Notes (Signed)
 [facility]    CSN: [id number] Arrival date & time: 06/26/24  1641      History   Chief Complaint No chief complaint on file.   HPI Amanda Herrera is a 38 y.o. female.   Patient presenting today with about a week of ongoing significant sinus pressure between her eyes and her cheeks.  Denies fever, chills, chest pain, sore throat, abdominal pain, vomiting, diarrhea.  History of seasonal allergies not currently on anything for this.  Was seen in the emergency department for this issue several days ago and treated with Flexeril which she states maybe gives a bit of relief but does not resolve her sinus pressure fully.  Trying Navage with only mild temporary benefit.  Just finished clindamycin for a dental infection 2 days ago.    Past Medical History:  Diagnosis Date   Asthma    Hypertension    PCOS (polycystic ovarian syndrome)     There are no active problems to display for this patient.   Past Surgical History:  Procedure Laterality Date   HIP SURGERY      OB History   No obstetric history on file.      Home Medications    Prior to Admission medications   Medication Sig Start Date End Date Taking? Authorizing Provider  cetirizine  (ZYRTEC  ALLERGY) 10 MG tablet Take 1 tablet (10 mg total) by mouth daily. 06/26/24  Yes Stuart Vernell Norris, PA-C  albuterol  (VENTOLIN  HFA) 108 [phone] Base) MCG/ACT inhaler Inhale 2 puffs into the lungs every 4 (four) hours as needed for wheezing or shortness of breath. 08/05/23   Stuart Vernell Norris, PA-C  amLODipine  (NORVASC ) 10 MG tablet Take 1 tablet (10 mg total) by mouth daily. Patient taking differently: Take 5 mg by mouth daily. 09/21/22   Kingsley, Victoria K, DO  azelastine (ASTELIN) 0.1 % nasal spray Place 1 spray into both nostrils 2 (two) times daily. Use in each nostril as directed 06/26/24   Stuart Vernell Norris, PA-C  cetirizine  (ZYRTEC  ALLERGY) 10 MG tablet Take 1 tablet (10 mg total) by mouth daily.  06/26/24   Stuart Vernell Norris, PA-C  cyclobenzaprine (FLEXERIL) 10 MG tablet Take 1 tablet (10 mg total) by mouth 2 (two) times daily as needed for muscle spasms. 06/22/24   Rogelia Jerilynn RAMAN, MD  fluticasone  (FLONASE ) 50 MCG/ACT nasal spray Place 1 spray into both nostrils 2 (two) times daily. 08/05/23   Stuart Vernell Norris, PA-C    Family History Family History  Problem Relation Age of Onset   Hypertension Mother    Hypertension Father    Diabetes Father     Social History Social History   Tobacco Use   Smoking status: Never   Smokeless tobacco: Never  Substance Use Topics   Drug use: Never     Allergies   Augmentin [amoxicillin-pot clavulanate] and Penicillins   Review of Systems Review of Systems PER HPI  Physical Exam Triage Vital Signs ED Triage Vitals  Encounter Vitals Group     BP 06/26/24 1650 (!) 169/117     Girls Systolic BP Percentile --      Girls Diastolic BP Percentile --      Boys Systolic BP Percentile --      Boys Diastolic BP Percentile --      Pulse Rate 06/26/24 1650 99     Resp 06/26/24 1650 18     Temp 06/26/24 1650 98.6 F (37 C)     Temp Source 06/26/24 1650  Oral     SpO2 06/26/24 1650 98 %     Weight --      Height --      Head Circumference --      Peak Flow --      Pain Score 06/26/24 1653 0     Pain Loc --      Pain Education --      Exclude from Growth Chart --    No data found.  Updated Vital Signs BP (!) 169/117 (BP Location: Right Wrist)   Pulse 99   Temp 98.6 F (37 C) (Oral)   Resp 18   LMP 05/23/2024 (Approximate)   SpO2 98%   Visual Acuity Right Eye Distance:   Left Eye Distance:   Bilateral Distance:    Right Eye Near:   Left Eye Near:    Bilateral Near:     Physical Exam Vitals and nursing note reviewed.  Constitutional:      Appearance: Normal appearance. She is not ill-appearing.  HENT:     Head: Atraumatic.     Nose:     Comments: Bilateral nasal turbinates significantly boggy and  erythematous    Mouth/Throat:     Mouth: Mucous membranes are moist.     Pharynx: Oropharynx is clear.  Eyes:     Extraocular Movements: Extraocular movements intact.     Conjunctiva/sclera: Conjunctivae normal.  Cardiovascular:     Rate and Rhythm: Normal rate and regular rhythm.     Heart sounds: Normal heart sounds.  Pulmonary:     Effort: Pulmonary effort is normal.     Breath sounds: Normal breath sounds.  Musculoskeletal:        General: Normal range of motion.     Cervical back: Normal range of motion and neck supple.  Skin:    General: Skin is warm and dry.  Neurological:     Mental Status: She is alert and oriented to person, place, and time.  Psychiatric:        Mood and Affect: Mood normal.        Thought Content: Thought content normal.        Judgment: Judgment normal.      UC Treatments / Results  Labs (all labs ordered are listed, but only abnormal results are displayed) Labs Reviewed - No data to display  EKG   Radiology No results found.  Procedures Procedures (including critical care time)  Medications Ordered in UC Medications - No data to display  Initial Impression / Assessment and Plan / UC Course  I have reviewed the triage vital signs and the nursing notes.  Pertinent labs & imaging results that were available during my care of the patient were reviewed by me and considered in my medical decision making (see chart for details).     Blood pressure significantly elevated in triage, otherwise vital signs reassuring.  Exam reassuring today, suspect allergic sinusitis.  Restart Zyrtec  daily, Astelin twice daily, Flonase , Coricidin HBP as needed.  Discussed cold over-the-counter medications and home care that will not further elevate blood pressure.  Return for worsening or unresolving symptoms.  Final Clinical Impressions(s) / UC Diagnoses   Final diagnoses:  Allergic sinusitis  Elevated blood pressure reading     Discharge Instructions       In addition to the prescribed medications, you may use Flonase  up to twice a day and Coricidin HBP 3 times daily as needed for breakthrough sinus pressure and congestion.  You may continue sinus  rinses, humidifiers and other supportive measures additionally.    ED Prescriptions     Medication Sig Dispense Auth. Provider   cetirizine  (ZYRTEC  ALLERGY) 10 MG tablet Take 1 tablet (10 mg total) by mouth daily. 30 tablet Stuart Vernell Norris, PA-C   azelastine (ASTELIN) 0.1 % nasal spray Place 1 spray into both nostrils 2 (two) times daily. Use in each nostril as directed 30 mL Stuart Vernell Norris, PA-C      PDMP not reviewed this encounter.   Stuart Vernell Norris, [place] 06/26/24 1800

## 2024-06-26 NOTE — Discharge Instructions (Signed)
 In addition to the prescribed medications, you may use Flonase  up to twice a day and Coricidin HBP 3 times daily as needed for breakthrough sinus pressure and congestion.  You may continue sinus rinses, humidifiers and other supportive measures additionally.

## 2024-06-26 NOTE — Telephone Encounter (Signed)
 Patient called and states [facility] did not get the astelin nasal spray.  Rx re sent

## 2024-06-26 NOTE — ED Triage Notes (Signed)
 Head pressure, and pressure across nose x 1  weeks.  Was seen in ED on Saturday for same.  States does have some nasal drainage at times.   states currently being treated for a tooth infection and has been taking ibuprofen and clindamycin (finished antibiotic 2 days ago).

## 2024-07-01 DIAGNOSIS — R7309 Other abnormal glucose: Secondary | ICD-10-CM | POA: Diagnosis not present

## 2024-07-01 DIAGNOSIS — F320 Major depressive disorder, single episode, mild: Secondary | ICD-10-CM | POA: Diagnosis not present

## 2024-07-01 DIAGNOSIS — E282 Polycystic ovarian syndrome: Secondary | ICD-10-CM | POA: Diagnosis not present

## 2024-07-01 DIAGNOSIS — E785 Hyperlipidemia, unspecified: Secondary | ICD-10-CM | POA: Diagnosis not present

## 2024-07-01 DIAGNOSIS — J4520 Mild intermittent asthma, uncomplicated: Secondary | ICD-10-CM | POA: Diagnosis not present

## 2024-07-01 DIAGNOSIS — I10 Essential (primary) hypertension: Secondary | ICD-10-CM | POA: Diagnosis not present

## 2024-08-15 ENCOUNTER — Other Ambulatory Visit (HOSPITAL_BASED_OUTPATIENT_CLINIC_OR_DEPARTMENT_OTHER): Payer: Self-pay

## 2024-08-16 ENCOUNTER — Other Ambulatory Visit (HOSPITAL_COMMUNITY): Payer: Self-pay

## 2024-08-16 ENCOUNTER — Other Ambulatory Visit (HOSPITAL_BASED_OUTPATIENT_CLINIC_OR_DEPARTMENT_OTHER): Payer: Self-pay

## 2024-08-19 ENCOUNTER — Other Ambulatory Visit (HOSPITAL_COMMUNITY): Payer: Self-pay

## 2024-08-19 MED ORDER — AMLODIPINE BESYLATE 10 MG PO TABS
10.0000 mg | ORAL_TABLET | Freq: Every day | ORAL | 1 refills | Status: AC
  Filled 2024-08-29: qty 90, 90d supply, fill #0

## 2024-08-19 MED ORDER — AZELASTINE HCL 137 MCG/SPRAY NA SOLN: 1.0000 | Freq: Two times a day (BID) | NASAL | 2 refills | Status: AC

## 2024-08-19 MED ORDER — AMLODIPINE BESYLATE 5 MG PO TABS: 5.0000 mg | ORAL_TABLET | Freq: Every day | ORAL | 0 refills | Status: AC

## 2024-08-19 MED ORDER — SPIRONOLACTONE 25 MG PO TABS: 25.0000 mg | ORAL_TABLET | Freq: Every day | ORAL | 0 refills | Status: AC

## 2024-08-20 ENCOUNTER — Other Ambulatory Visit (HOSPITAL_COMMUNITY): Payer: Self-pay

## 2024-08-29 ENCOUNTER — Other Ambulatory Visit (HOSPITAL_BASED_OUTPATIENT_CLINIC_OR_DEPARTMENT_OTHER): Payer: Self-pay

## 2024-08-29 MED ORDER — METFORMIN HCL ER 500 MG PO TB24
500.0000 mg | ORAL_TABLET | Freq: Every evening | ORAL | 1 refills | Status: AC
  Filled 2024-08-29: qty 90, 90d supply, fill #0

## 2024-09-11 ENCOUNTER — Other Ambulatory Visit (HOSPITAL_BASED_OUTPATIENT_CLINIC_OR_DEPARTMENT_OTHER): Payer: Self-pay

## 2024-09-11 MED ORDER — CLINDAMYCIN HCL 300 MG PO CAPS
300.0000 mg | ORAL_CAPSULE | Freq: Three times a day (TID) | ORAL | 0 refills | Status: AC
  Filled 2024-09-11: qty 21, 7d supply, fill #0
